# Patient Record
Sex: Female | Born: 1975
Health system: Southern US, Community
[De-identification: ages and names within clinical notes are randomized; demographics above are authoritative.]

## PROBLEM LIST (undated history)

## (undated) DIAGNOSIS — E559 Vitamin D deficiency, unspecified: Secondary | ICD-10-CM

## (undated) DIAGNOSIS — R002 Palpitations: Secondary | ICD-10-CM

## (undated) DIAGNOSIS — N83209 Unspecified ovarian cyst, unspecified side: Secondary | ICD-10-CM

## (undated) DIAGNOSIS — K589 Irritable bowel syndrome without diarrhea: Secondary | ICD-10-CM

## (undated) DIAGNOSIS — L565 Disseminated superficial actinic porokeratosis (DSAP): Secondary | ICD-10-CM

## (undated) DIAGNOSIS — R011 Cardiac murmur, unspecified: Secondary | ICD-10-CM

## (undated) DIAGNOSIS — E039 Hypothyroidism, unspecified: Secondary | ICD-10-CM

## (undated) DIAGNOSIS — F329 Major depressive disorder, single episode, unspecified: Secondary | ICD-10-CM

## (undated) DIAGNOSIS — G935 Compression of brain: Secondary | ICD-10-CM

## (undated) DIAGNOSIS — F32A Depression, unspecified: Secondary | ICD-10-CM

## (undated) DIAGNOSIS — F411 Generalized anxiety disorder: Secondary | ICD-10-CM

## (undated) HISTORY — PX: WISDOM TOOTH EXTRACTION: SHX21

## (undated) HISTORY — DX: Palpitations: R00.2

## (undated) HISTORY — DX: Disseminated superficial actinic porokeratosis (DSAP): L56.5

## (undated) HISTORY — DX: Irritable bowel syndrome, unspecified: K58.9

## (undated) HISTORY — DX: Unspecified ovarian cyst, unspecified side: N83.209

## (undated) HISTORY — PX: OVARIAN CYST REMOVAL: SHX89

## (undated) HISTORY — DX: Cardiac murmur, unspecified: R01.1

## (undated) HISTORY — DX: Hypothyroidism, unspecified: E03.9

## (undated) HISTORY — DX: Vitamin D deficiency, unspecified: E55.9

## (undated) HISTORY — DX: Generalized anxiety disorder: F41.1

## (undated) HISTORY — DX: Major depressive disorder, single episode, unspecified: F32.9

## (undated) HISTORY — DX: Depression, unspecified: F32.A

## (undated) HISTORY — DX: Compression of brain: G93.5

## (undated) HISTORY — PX: BACK SURGERY: SHX140

---
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
## 1999-10-09 ENCOUNTER — Other Ambulatory Visit: Admission: RE | Admit: 1999-10-09 | Discharge: 1999-10-09 | Payer: Self-pay | Admitting: Obstetrics and Gynecology

## 2000-08-13 HISTORY — PX: OVARIAN CYST REMOVAL: SHX89

## 2000-08-16 ENCOUNTER — Ambulatory Visit (HOSPITAL_COMMUNITY): Admission: RE | Admit: 2000-08-16 | Discharge: 2000-08-16 | Payer: Self-pay | Admitting: Obstetrics and Gynecology

## 2000-08-16 ENCOUNTER — Encounter: Payer: Self-pay | Admitting: Obstetrics and Gynecology

## 2000-10-02 ENCOUNTER — Ambulatory Visit (HOSPITAL_COMMUNITY): Admission: RE | Admit: 2000-10-02 | Discharge: 2000-10-02 | Payer: Self-pay | Admitting: Obstetrics and Gynecology

## 2000-10-02 ENCOUNTER — Encounter (INDEPENDENT_AMBULATORY_CARE_PROVIDER_SITE_OTHER): Payer: Self-pay

## 2000-10-16 ENCOUNTER — Other Ambulatory Visit: Admission: RE | Admit: 2000-10-16 | Discharge: 2000-10-16 | Payer: Self-pay | Admitting: Obstetrics and Gynecology

## 2001-10-14 ENCOUNTER — Other Ambulatory Visit: Admission: RE | Admit: 2001-10-14 | Discharge: 2001-10-14 | Payer: Self-pay | Admitting: Gynecology

## 2002-05-14 ENCOUNTER — Inpatient Hospital Stay (HOSPITAL_COMMUNITY): Admission: AD | Admit: 2002-05-14 | Discharge: 2002-05-14 | Payer: Self-pay | Admitting: Obstetrics and Gynecology

## 2002-05-14 ENCOUNTER — Encounter: Payer: Self-pay | Admitting: Obstetrics and Gynecology

## 2002-08-18 ENCOUNTER — Ambulatory Visit (HOSPITAL_COMMUNITY): Admission: RE | Admit: 2002-08-18 | Discharge: 2002-08-18 | Payer: Self-pay | Admitting: Obstetrics and Gynecology

## 2002-08-18 ENCOUNTER — Encounter: Payer: Self-pay | Admitting: Obstetrics and Gynecology

## 2002-09-14 ENCOUNTER — Encounter: Payer: Self-pay | Admitting: Obstetrics and Gynecology

## 2002-09-14 ENCOUNTER — Ambulatory Visit (HOSPITAL_COMMUNITY): Admission: RE | Admit: 2002-09-14 | Discharge: 2002-09-14 | Payer: Self-pay | Admitting: Obstetrics and Gynecology

## 2002-09-28 ENCOUNTER — Encounter: Payer: Self-pay | Admitting: Obstetrics and Gynecology

## 2002-09-28 ENCOUNTER — Ambulatory Visit (HOSPITAL_COMMUNITY): Admission: RE | Admit: 2002-09-28 | Discharge: 2002-09-28 | Payer: Self-pay | Admitting: Obstetrics and Gynecology

## 2002-12-07 ENCOUNTER — Ambulatory Visit (HOSPITAL_COMMUNITY): Admission: RE | Admit: 2002-12-07 | Discharge: 2002-12-07 | Payer: Self-pay | Admitting: Obstetrics and Gynecology

## 2002-12-07 ENCOUNTER — Encounter: Payer: Self-pay | Admitting: Obstetrics and Gynecology

## 2003-01-06 ENCOUNTER — Ambulatory Visit (HOSPITAL_COMMUNITY): Admission: RE | Admit: 2003-01-06 | Discharge: 2003-01-06 | Payer: Self-pay | Admitting: Obstetrics and Gynecology

## 2003-01-06 ENCOUNTER — Encounter: Payer: Self-pay | Admitting: Obstetrics and Gynecology

## 2003-04-09 ENCOUNTER — Inpatient Hospital Stay (HOSPITAL_COMMUNITY): Admission: AD | Admit: 2003-04-09 | Discharge: 2003-04-09 | Payer: Self-pay | Admitting: Obstetrics and Gynecology

## 2003-05-08 ENCOUNTER — Inpatient Hospital Stay (HOSPITAL_COMMUNITY): Admission: AD | Admit: 2003-05-08 | Discharge: 2003-05-10 | Payer: Self-pay | Admitting: Obstetrics and Gynecology

## 2003-06-23 ENCOUNTER — Other Ambulatory Visit: Admission: RE | Admit: 2003-06-23 | Discharge: 2003-06-23 | Payer: Self-pay | Admitting: Obstetrics and Gynecology

## 2003-08-31 ENCOUNTER — Ambulatory Visit (HOSPITAL_COMMUNITY): Admission: RE | Admit: 2003-08-31 | Discharge: 2003-08-31 | Payer: Self-pay | Admitting: Obstetrics and Gynecology

## 2004-06-21 ENCOUNTER — Other Ambulatory Visit: Admission: RE | Admit: 2004-06-21 | Discharge: 2004-06-21 | Payer: Self-pay | Admitting: Obstetrics and Gynecology

## 2004-07-13 ENCOUNTER — Ambulatory Visit (HOSPITAL_COMMUNITY): Admission: RE | Admit: 2004-07-13 | Discharge: 2004-07-13 | Payer: Self-pay | Admitting: Obstetrics and Gynecology

## 2004-07-17 ENCOUNTER — Ambulatory Visit (HOSPITAL_COMMUNITY): Admission: RE | Admit: 2004-07-17 | Discharge: 2004-07-17 | Payer: Self-pay | Admitting: Obstetrics and Gynecology

## 2004-07-24 ENCOUNTER — Ambulatory Visit (HOSPITAL_COMMUNITY): Admission: RE | Admit: 2004-07-24 | Discharge: 2004-07-24 | Payer: Self-pay | Admitting: Obstetrics and Gynecology

## 2004-08-08 ENCOUNTER — Ambulatory Visit (HOSPITAL_COMMUNITY): Admission: RE | Admit: 2004-08-08 | Discharge: 2004-08-08 | Payer: Self-pay | Admitting: Obstetrics and Gynecology

## 2004-08-18 ENCOUNTER — Ambulatory Visit (HOSPITAL_COMMUNITY): Admission: RE | Admit: 2004-08-18 | Discharge: 2004-08-18 | Payer: Self-pay | Admitting: Obstetrics and Gynecology

## 2004-10-12 ENCOUNTER — Ambulatory Visit (HOSPITAL_COMMUNITY): Admission: RE | Admit: 2004-10-12 | Discharge: 2004-10-12 | Payer: Self-pay | Admitting: Obstetrics and Gynecology

## 2004-10-31 ENCOUNTER — Ambulatory Visit (HOSPITAL_COMMUNITY): Admission: RE | Admit: 2004-10-31 | Discharge: 2004-10-31 | Payer: Self-pay | Admitting: Obstetrics and Gynecology

## 2005-03-15 ENCOUNTER — Inpatient Hospital Stay (HOSPITAL_COMMUNITY): Admission: AD | Admit: 2005-03-15 | Discharge: 2005-03-15 | Payer: Self-pay | Admitting: Obstetrics and Gynecology

## 2005-03-17 ENCOUNTER — Inpatient Hospital Stay (HOSPITAL_COMMUNITY): Admission: AD | Admit: 2005-03-17 | Discharge: 2005-03-18 | Payer: Self-pay | Admitting: Obstetrics and Gynecology

## 2005-06-28 ENCOUNTER — Other Ambulatory Visit: Admission: RE | Admit: 2005-06-28 | Discharge: 2005-06-28 | Payer: Self-pay | Admitting: Obstetrics and Gynecology

## 2005-09-10 ENCOUNTER — Ambulatory Visit (HOSPITAL_COMMUNITY): Admission: RE | Admit: 2005-09-10 | Discharge: 2005-09-10 | Payer: Self-pay | Admitting: Obstetrics and Gynecology

## 2006-07-30 ENCOUNTER — Ambulatory Visit (HOSPITAL_COMMUNITY): Admission: RE | Admit: 2006-07-30 | Discharge: 2006-07-30 | Payer: Self-pay | Admitting: Obstetrics and Gynecology

## 2007-01-05 ENCOUNTER — Inpatient Hospital Stay (HOSPITAL_COMMUNITY): Admission: AD | Admit: 2007-01-05 | Discharge: 2007-01-06 | Payer: Self-pay | Admitting: Obstetrics and Gynecology

## 2007-02-25 ENCOUNTER — Ambulatory Visit (HOSPITAL_COMMUNITY): Admission: RE | Admit: 2007-02-25 | Discharge: 2007-02-25 | Payer: Self-pay | Admitting: Obstetrics and Gynecology

## 2007-04-01 ENCOUNTER — Inpatient Hospital Stay (HOSPITAL_COMMUNITY): Admission: AD | Admit: 2007-04-01 | Discharge: 2007-04-01 | Payer: Self-pay | Admitting: Obstetrics and Gynecology

## 2007-04-17 ENCOUNTER — Inpatient Hospital Stay (HOSPITAL_COMMUNITY): Admission: AD | Admit: 2007-04-17 | Discharge: 2007-04-18 | Payer: Self-pay | Admitting: Obstetrics and Gynecology

## 2010-02-08 ENCOUNTER — Inpatient Hospital Stay (HOSPITAL_COMMUNITY): Admission: AD | Admit: 2010-02-08 | Discharge: 2010-02-08 | Payer: Self-pay | Admitting: Obstetrics and Gynecology

## 2010-03-12 ENCOUNTER — Inpatient Hospital Stay (HOSPITAL_COMMUNITY): Admission: AD | Admit: 2010-03-12 | Discharge: 2010-03-14 | Payer: Self-pay | Admitting: Obstetrics and Gynecology

## 2010-10-27 LAB — CBC
HCT: 35.3 % — ABNORMAL LOW (ref 36.0–46.0)
Hemoglobin: 11.9 g/dL — ABNORMAL LOW (ref 12.0–15.0)
MCH: 31.9 pg (ref 26.0–34.0)
MCHC: 33.7 g/dL (ref 30.0–36.0)
MCV: 94.6 fL (ref 78.0–100.0)
Platelets: 136 10*3/uL — ABNORMAL LOW (ref 150–400)
RBC: 3.73 MIL/uL — ABNORMAL LOW (ref 3.87–5.11)
RDW: 12.9 % (ref 11.5–15.5)
WBC: 12.5 10*3/uL — ABNORMAL HIGH (ref 4.0–10.5)

## 2010-10-28 LAB — CBC
HCT: 37.8 % (ref 36.0–46.0)
Hemoglobin: 13 g/dL (ref 12.0–15.0)
MCH: 32 pg (ref 26.0–34.0)
MCHC: 34.4 g/dL (ref 30.0–36.0)
MCV: 93 fL (ref 78.0–100.0)
Platelets: 146 10*3/uL — ABNORMAL LOW (ref 150–400)
RBC: 4.07 MIL/uL (ref 3.87–5.11)
RDW: 13.1 % (ref 11.5–15.5)
WBC: 9.3 10*3/uL (ref 4.0–10.5)

## 2010-10-28 LAB — RPR: RPR Ser Ql: NONREACTIVE

## 2010-10-29 LAB — URINALYSIS, ROUTINE W REFLEX MICROSCOPIC
Bilirubin Urine: NEGATIVE
Glucose, UA: NEGATIVE mg/dL
Hgb urine dipstick: NEGATIVE
Ketones, ur: 15 mg/dL — AB
Nitrite: NEGATIVE
Protein, ur: NEGATIVE mg/dL
Specific Gravity, Urine: 1.01 (ref 1.005–1.030)
Urobilinogen, UA: 0.2 mg/dL (ref 0.0–1.0)
pH: 6.5 (ref 5.0–8.0)

## 2010-12-26 NOTE — H&P (Signed)
Brandy Ortega, Brandy Ortega         ACCOUNT NO.:  [id number]   MEDICAL RECORD NO.:  [id number]          PATIENT TYPE:  INP   LOCATION:  9164                          FACILITY:  [facility]   PHYSICIAN:  Crist Fat. Rivard, M.D. DATE OF BIRTH:  December 06, 1975   DATE OF ADMISSION:  04/17/2007  DATE OF DISCHARGE:                              HISTORY & PHYSICAL   Brandy Ortega is a 35 year old gravida 5, para 2-0-2-2 at 40-3/7 weeks,  who presented in active labor with cervix __________ on admission.  She  had onset of labor approximately 4-5 a.m. this morning with rapid  progression.  She denied any leaking or bleeding and reported positive  fetal movement.  She denied any HSV lesions or prodrome and no PIH  symptoms.  Her pregnancy has been remarkable for (1) Group B strep  negative.  (2)  History of dermoid cyst with previous removal.  (3)  History of HSV II, no recent recurrent lesions, no prodrome, and the  patient has been on Valtrex.  (4)  History of infertility with the  patient on progesterone suppositories in early pregnancy.  (5) History  of pyelonephritis.  (6) History of hypothyroidism with the patient on  Synthroid 0.625.   PRENATAL LABS:  Blood type is B positive, Rh antibody negative, VDRL  nonreactive, rubella titer positive, hepatitis B surface antigen  negative, HIV nonreactive, GC and Chlamydia cultures were declined in  the first trimester.  They were also declined at 36 weeks.  Pap smear  was normal in December 2007.  Cystic fibrosis testing was negative.  Group B strep culture at 36 weeks that were negative.  Hemoglobin upon  entry into practice was 12.8.  It was within normal limits at 28 weeks.  The patient had first trimester screening that was normal.  Nuchal  translucency in first trimester screening were normal.  AFP was normal.  Glucola was normal.  Dr. Lucianne Muss followed her TSHs.  The rest of her  pregnancy was essentially uncomplicated.  Group B strep culture was  negative at 36 weeks.  Cultures were also negative.   HISTORY OF PRESENT PREGNANCY:  The patient entered care at approximately  7 weeks.  She had an ultrasound the day prior to that visit with normal  fetal heart tones, and there was a previa noted.  Dr. Lucianne Muss was  following her TSHs.  Fetal heart tones were auscultated on exam  approximately 2 weeks from that.  She also had nuchal translucency at 11-  6/7 weeks which was normal.  Placenta had reverted to low-lying.  AFP  was normal.  She had some nausea and diarrhea at about 18 weeks.  This  was evaluated and was found to be no abnormal findings.  By 19 weeks,  she had another ultrasound showing an anterior placenta without a previa  and normal findings.  She did have a stool culture for C. difficile due  to recurrent diarrhea.  She also had a gallbladder ultrasound during her  pregnancy secondary to persistent nausea and vomiting.  This was  negative.  She had some scattered uterine contractions at 28 weeks.  TSH  by Dr. Lucianne Muss at approximately 26 weeks was 2.5.  The rest of her  pregnancy was essentially uncomplicated.  She did have some fungal  itching in her rectal area at 37 weeks.  She was prescribed Mycolog.  Group B strep cultures and other cultures were negative.   OBSTETRICAL HISTORY:  1. In 2003, August and October, she had first trimester miscarriages.  2. In 2004, she had a vaginal birth of a female infant, weight 7 pounds      2 ounces at 40-5/7 weeks.  She was in labor 8 hours.  She had      epidural anesthesia.  She did have manual removal of placenta.  3. In 2006, she had a vaginal birth of a female infant, weighed 8      pounds 1 ounce at 39 weeks.  She was in labor 9 hours.  She had      epidural anesthesia without complications.   MEDICAL HISTORY:  1. She is a previous condom user.  2. She had a right ovarian cystectomy and a history of a left ovarian      cyst in the past.  3. She did have a right ovarian dermoid  cyst.  4. She did have history of infertility which required Clomid in the      past.  5. She had a spontaneous conception in 2004, 2006, and the current      pregnancy, but she was on progesterone suppositories until about 14      weeks.  6. She did have a history of HSV II but has had rare outbreaks.  7. She reports the usual childhood illnesses.  8. She was diagnosed with hypothyroidism in November 2005.  She is      followed by Dr. Lucianne Muss and has been on Synthroid.  9. She has occasional UTIs.  10.She did have some postpartum depression in 2004, was treated with      Zoloft but has had no issues since.  11.She fell on ice as a young person and had a concussion.   SURGICAL HISTORY:  1. Wisdom teeth removed.  2. Diagnostic laparoscopy.  3. Ovarian cystectomy in the past.  4. She had anesthetic complications of nausea and vomiting in the      past.  5. Only other hospitalizations were for childbirth.   FAMILY HISTORY:  Paternal grandfather had a valve replacement, and a  maternal uncle had an MI.  Paternal grandfather had hypertension.  Paternal uncle had emphysema.  Paternal grandfather and paternal  grandmother had type 2 diabetes.  Paternal grandmother had a CVA event.   GENETIC HISTORY:  Unremarkable.   SOCIAL HISTORY:  The patient is married to the father of the baby.  He  Is involved and supportive.  His name is [employer].  The patient  is Caucasian of the Catholic faith.  She has a Event organiser.  She is a  Publishing rights manager.  Her husband is a Nurse, adult.  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  She denies any alcohol, drug,  or tobacco use during this pregnancy.   PHYSICAL EXAMINATION:  VITAL SIGNS:  Stable.  The patient is afebrile.  HEENT:  Within normal limits.  LUNGS:  Bilateral breath sounds are clear.  HEART:  Regular rate and rhythm without murmur.  BREASTS:  Soft and nontender.  ABDOMEN:  Fundal height is approximately 39.  Soft and  nontender between  contractions, but uterine contractions are every 2-3  minutes.  Fetal  heart rate is reassuring with a negative spontaneous CST.  CERVIX:  __________  100% vertex at a 0 station with an intact bag of  water.  There are no HSV lesions noted.  The patient also denies any  prodrome.  The patient does have scattered keratotic lesions over her  whole body.  This has been a genetic issue.  EXTREMITIES:  Deep tendon reflexes are 2+ without clonus.  There is a  trace edema noted.   IMPRESSION:  1. Intrauterine pregnancy at 40-3/7 weeks.  2. Active labor and now in transition.  3. Group B strep negative.   PLAN:  1. Admit to birthing suite for consult with Dr. Estanislado Pandy as attending      physician.  2. The patient strongly requesting epidural.  She feels more pain than      pressure with her current cervical exam.  We will attempt to place      the epidural prior to delivery, but the patient understands that      labor may progress and preclude the opportunity for safe epidural.      Chip Boer L. Emilee Hero, C.N.M.      Crist Fat Rivard, M.D.  Electronically Signed    VLL/MEDQ  D:  04/17/2007  T:  04/17/2007  Job:  [id number]

## 2010-12-29 NOTE — H&P (Signed)
[facility]  Patient:    Brandy Ortega, Brandy Ortega                  MRN: [id number] Adm. Date:  10/02/00 Attending:  Erie Noe P. Pennie Rushing, M.D.                         History and Physical  HISTORY OF PRESENT ILLNESS:   The patient is a 35 year old white married female para 0 who presents for ovarian cystectomy for a presumed right ovarian dermoid cyst. The patient had this first noted at the time of an examination in December of 2001 during the process of ovulation induction for infertility. The patient had undergone three cycles of Clomid with one of those being ovulatory at 150 mg. At the time of her ovary check in December, the patient was noted to have right ovarian enlargement which was documented on ultrasound and persisted over the next cycles with characteristics of a dermoid. The patient was given the option of ovarian cystectomy for this presumed dermoid versus further ovulation induction and has opted for ovarian cystectomy. The ultrasound shows the dermoid to be approximately 2 cm in size.  PAST MEDICAL HISTORY:         The patient has a history of pyelonephritis. She has occasional migraines managed with rest. She has had the usual childhood illnesses. She did have a concussion after a fall in 1997 with an approximately 5-hour period of amnesia without any residual. She has some seasonal allergies for which she uses Rhinocort.  GYNECOLOGIC HISTORY:          The patient had menarche at age 79 with menses every 24 to 32 days, then regularized on oral contraceptive pills. She has returned to that pattern after discontinuing oral contraceptive pills in September of 2000. Her last menstrual period began on July 21, 2000. She had her last Pap smear in February of 2001 and was normal. She has no history of abnormal Pap smears. She does have a history of Bartholin gland cyst that is asymptomatic never enlarges more than a pea size.  CURRENT  MEDICATIONS:          1. Rhinocort.                               2. Multivitamin with 400 mcg of folic acid.  SOCIAL HISTORY/HABITS:        The patient is employed and lives with her husband who is employed by the [employer].  ALLERGIES:                    None known.  FAMILY HISTORY:               Positive for heart disease, mitral valve prolapse, hypertension, diabetes, asthma, and hypothyroidism.  REVIEW OF SYSTEMS:            Specifically negative for any abdominal pain or dyspareunia.  PHYSICAL EXAMINATION:  VITAL SIGNS:                  Blood pressure is 110/70.  LUNGS:                        Clear.  HEART:                        Regular rate  and rhythm.  ABDOMEN:                      Soft, without masses, organomegaly, or tenderness.  PELVIC:                       EG/BUS within normal limits. The vagina is rugous. The cervix is nontender. The uterus is normal size, shape, consistency, anterior, mobile, and nontender. Adnexa:  There is minimal enlargement of the right ovary which is nontender. Left adnexa without mass. Rectovaginal confirms.  IMPRESSION:                   Probable right ovarian dermoid cyst, persistent.  DISPOSITION:                  A laparoscopic removal of the right ovary cyst will be undertaken. The patient seems to understand the risks of anesthesia, bleeding, infection, and damage to adjacent organs. She wishes to proceed. This will be done at [facility] on October 02, 2000. DD:  10/01/00 TD:  10/01/00 Job: [id number]

## 2010-12-29 NOTE — H&P (Signed)
Brandy Ortega, Brandy Ortega         ACCOUNT NO.:  [id number]   MEDICAL RECORD NO.:  [id number]          PATIENT TYPE:  AMB   LOCATION:  SDC                           FACILITY:  [facility]   PHYSICIAN:  Hal Morales, M.D.DATE OF BIRTH:  Jun 01, 1976   DATE OF ADMISSION:  DATE OF DISCHARGE:                                HISTORY & PHYSICAL   HISTORY OF PRESENT ILLNESS:  Brandy Ortega is 35 year old married white  female, para 1-0-2-1 who presents for a laparoscopic left ovarian cystectomy  because of a persistent left ovarian cyst which is presumed to be a dermoid.  In 2002, the patient underwent a laparoscopic right ovarian cystectomy for a  dermoid cyst along with a conservative resection of endometriosis.  In  October of 2003, while being evaluated for pelvic pain in early pregnancy,  she was noted to have a 1.8 cm x 1.5 cm x 1.1 cm complex left ovarian cyst  which was suspicious for a dermoid.  A followup ultrasound in January of  2004, following the pregnancy which ended in spontaneous abortion, it was  shown that there had been no significant change in her left ovarian cyst as  it measured 1.7 cm x 1.2 cm x 1.5 cm.  The remainder of that study was  within normal limits. Essentially the same status of this patient's left  ovarian cyst was confirmed in February 2004 (measurements being 1.5 x 1.2 x  1.5 cm) during an ultrasound for first trimester bleeding.  In January of  2005, the patient began to complain of an intermittent burning sensation in  her left lower quadrant which had been occuring weekly for several months.  An ultrasound at that time was within normal limits except there was a  stable left ovarian cyst measuring 1.5 x 1.5 x 1.4 cm which was probably a  dermoid.  The patient's symptoms of burning sensation in her left lower  quadrant abated over time and she denies any symptoms since that time.  Additionally she has not had any dyspareunia, urinary tract symptoms or  change in her bowel movements.  In September of 2005, the patient had a  followup ultrasound to evaluate her left ovarian cyst status which revealed  an increase in size to 2.8 cm x 1.3 cm.  Given the patient's history of a  right dermoid and her desire to try to conceive in the spring of 2006, she  wishes to proceed with a left ovarian cystectomy by laparoscopy.   OB HISTORY:  Gravida 3, para 1-0-2-1, patient has a history of infertility,  postpartum depression and obsessive compulsive disorder during the  postpartum period.   GYN HISTORY:  Menarche 35 years old, last menstrual period was June 04, 2004. The patient is currently not using any contraction, has a history of  herpes simplex virus #2, denies any history of abnormal Pap smears.  Last  normal Pap smear was November 2005.   PAST MEDICAL HISTORY:  Endometriosis, migraines, Bartholin cyst, thyroid  goiter, a concussion secondary to a fall with five hours of amnesia;  however, no residual (1997), hypothyroidism, oligomenorrhea, and  pyelonephritis.  PAST SURGICAL HISTORY:  In 1995 dental surgery, 2002 right ovarian  cystectomy for a dermoid cyst along with conservative resection or  endometriosis and chromopertubation. The patient denies any history of blood  transfusions or problems with anesthesia.   FAMILY HISTORY:  Cardiovascular disease, asthma, hypothyroidism,  hypertension, diabetes and mitral valve prolapse.   SOCIAL HISTORY:  The patient is married and she works as a Engineer, civil (consulting).   HABITS:  She does not use tobacco and she drinks alcohol rarely.   CURRENT MEDICATIONS:  1.  Synthroid 50 mcg, 1 tablet daily.  2.  Multivitamins 1 tablet daily.  3.  Folate 400 mcg, 1 tablet daily.  4.  Valtrex 500 mg, 1 tablet twice daily for 3 days as needed.   ALLERGIES:  The patient has no known drug allergies.   REVIEW OF SYMPTOMS:  Positive for glasses/contact lenses, occasional muscle  related neck pain, palpitations  (negative cardiac workup with Dr. Fraser Din in  2004), seasonal allergies.   PHYSICAL EXAMINATION:  VITAL SIGNS:  Blood pressure 100/60, weight 178,  height 5 feet 7 inches tall.  NECK:  Supple. There is no adenopathy; however, the patient does have  diffuse enlargement of her right thyroid lobe.  HEART:  Regular rate and rhythm.  There is a 1/6 systolic ejection murmur at  the left sternal border.  There is no rub or gallop.  CHEST:  Lungs are clear to auscultation.  There are no wheezes, rales or  rhonchi.  BACK:  No CVA tenderness.  ABDOMEN:  Bowel sounds are present, soft without tenderness, guarding,  rebound or organomegaly.  EXTREMITIES:  Without cyanosis, clubbing or edema.  PELVIC:  EGBUS is within normal limits. Vagina is rugose, cervix is  nontender without lesions. The uterus is normal size, shape and consistency  without tenderness. Adnexa without tenderness or palpable masses.  Rectovaginal exam without tenderness or masses.  Urine pregnancy test is  negative.   IMPRESSION:  Persistent left ovarian cyst--probable dermoid.   DISPOSITION:  A discussion was held with the patient regarding the  indication for her procedure along with its risk which includes but are not  limited to reaction to anesthesia, damage to adjacent organs, infection and  excessive bleeding.  The patient has accepted these risks and has consented  to undergo a laparoscopic left ovarian cystectomy at [facility] on July 19, 2004 at 7:30 a.m.     Elmi   EJP/MEDQ  D:  07/12/2004  T:  07/12/2004  Job:  [id number]

## 2010-12-29 NOTE — H&P (Signed)
NAMEALEXZIA, KASLER         ACCOUNT NO.:  [id number]   MEDICAL RECORD NO.:  [id number]          PATIENT TYPE:  INP   LOCATION:  9174                          FACILITY:  [facility]   PHYSICIAN:  Hal Morales, M.D.DATE OF BIRTH:  05/21/1976   DATE OF ADMISSION:  03/17/2005  DATE OF DISCHARGE:                                HISTORY & PHYSICAL   Ms. Vanallen is a 35 year old married, white female, gravida 4, para 1, 0,  2, 1, at 39-1/7 weeks who presents with contractions tonight. She denies  leaking, bleeding, or signs and symptoms of PIH. She denies HSV prodrome.  Her pregnancy has been followed by the [facility] certified  nurse midwife service and has been remarkable for:   1.  Hypothyroid.  2.  History of ovarian cystectomy on the left.  3.  History of HSV-2.  4.  History of two spontaneous abortions.  5.  First trimester bleeding.  6.  Group B strep negative.  7.  Right ovarian dermoid cyst   PRENATAL LABS:  Collected on August 30, 2004: Hemoglobin 12.0, hematocrit  35.4, platelets 212,000.  Hepatitis B surface antigen negative. Rubella immune. Blood type B positive.  Antibody negative. HIV nonreactive. One hour Glucola from Dec 29, 2004 was  within normal limits. Coxsackie titers on Dec 21, 2004:  A was negative and  B was 3:5+. Her convalescent titers on Dec 29, 2004 within normal limits.  Culture of the vaginal tract for group B strep on February 20, 2005 was  negative.   HISTORY OF PRESENT PREGNANCY:  She presented for care at [facility] on  August 30, 2004 at 10-4/[redacted] weeks gestation.  She was being followed by Dr.  Lucianne Muss for her hypothyroidism and he planned TSH draws every 8 weeks  throughout the pregnancy. Pregnancy ultrasonography at [redacted] weeks gestation  showed growth consistent with previous dating, confirming [facility] of March 23, 2005. Anatomy scan was complete at a 20-weeks scan. The patient was exposed  to Coxsackie through her son at [redacted] weeks  gestation. Her titer showed  immunity. The rest of her prenatal care was unremarkable.   OBSTETRIC HISTORY:  She is a gravida 4, para 1, 0, 2, 1. In August of 2003  she had a first trimester SAB. In October, 2003 she had a first trimester  SAB. September, 2004 she had a vaginal delivery of a female infant weighing 7  pounds and 2 ounces at 40-5/[redacted] weeks gestation after 8 hours in labor. She  had an epidural for anesthesia. His name is Ethelene Browns. She had manual removal  of placenta.   ALLERGIES:  No known drug allergies.   PAST MEDICAL HISTORY:  She experienced menarche at the age of 41 with 35+  day cycles. She had a history of infertility requiring Clomid in the past  but with two spontaneous conceptions in 2004 and the current pregnancy. She  had a left ovarian cyst that was stable x2 years. She had a right ovarian  cystectomy. She has rare outbreaks of HSV. She has occasional yeast  infections. She was diagnosed with hypothyroidism in November, 2005. She  sees Dr. Lucianne Muss for that. She had postpartum depression requiring Zoloft. As  a child she fell on the ice and had a concussion. Headache history is  negative.   PAST SURGICAL HISTORY:  Remarkable for extraction of wisdom teeth. She had a  right ovarian cystectomy in the past.   FAMILY HISTORY:  Remarkable for paternal grandfather with valve replacement.  Maternal uncle with myocardial infarction. Maternal grandfather with chronic  hypertension. Paternal uncle with emphysema. Family members with type 2  diabetes. Maternal grandmother with CVA.   SOCIAL HISTORY:  The patient is married to the father of the baby, his name  is Thayer Ohm. He is involved and supportive. They are of the Catholic faith.  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  Father of the baby has 16 years of education and is employed full time as a  Emergency planning/management officer. They deny any alcohol, tobacco or illicit drug use with the  pregnancy.    PHYSICAL EXAMINATION:  VITAL SIGNS:  Stable, she is afebrile.  HEENT:  Grossly within normal limits.  CHEST:  Clear to auscultation.  HEART:  Regular rate and rhythm.  ABDOMEN:  Is gravid in contour, fundal height extends approximately 39 cm  above the pubic symphysis. Fetal heart rate is reassuring with intermittent  monitoring. Contractions every 3 minutes. Cervix is 5 to 6 cm, 80%, vertex  at the 0 station with intact bag of waters. No signs of HSV lesions present.  EXTREMITIES:  Normal.   ASSESSMENT:  1.  Intrauterine pregnancy at term.  2.  Active labor.  3.  Group B strep negative.   PLAN:  1.  Admit to birthing suite. Dr. Pennie Rushing has been notified.  2.  Routine C.N.M. orders.  3.  Patient plans epidural.  4.  Anticipate normal spontaneous vaginal birth.       KS/MEDQ  D:  03/17/2005  T:  03/17/2005  Job:  [id number]

## 2010-12-29 NOTE — H&P (Signed)
NAME:  Brandy Ortega, Brandy Ortega                   ACCOUNT NO.:  [id number]   MEDICAL RECORD NO.:  [id number]                   PATIENT TYPE:  INP   LOCATION:  9170                                 FACILITY:  [facility]   PHYSICIAN:  Janine Limbo, M.D.            DATE OF BIRTH:  08/07/1976   DATE OF ADMISSION:  05/08/2003  DATE OF DISCHARGE:                                HISTORY & PHYSICAL   HISTORY OF PRESENT ILLNESS:  Ms. Brandy Ortega is a 35 year old, gravida 3,  para 2-0-0-2, at 39-2/7 weeks who presented with onset of labor at  approximately 2:30 a.m., now with contractions every two minutes.  She  denies any spontaneous rupture of membranes.  She does have positive bloody  show.  She reports positive fetal movement.  The pregnancy has been  remarkable for:  1.  Two SABs.  2.  History of infertility.  3.  History of  dermoid cyst.  5.  History of HSV, but no recent or current lesions.  6.  History of pyelonephritis.  7.  First trimester bleeding.   PRENATAL LABORATORY DATA:  Blood type is B positive.  Rh antibody negative.  VDRL nonreactive.  Rubella titer positive.  Hepatitis B surface antigen  negative.  HIV nonreactive.  Group B Streptococcus culture was negative at  36 weeks.  Pap was normal.  Glucose challenge was normal.  AFP was normal.  The hemoglobin upon entry into practice was 11.6 at 28 weeks.  An EDC of  May 13, 2003, was established by ultrasound at approximately 8 weeks  and confirmed with ultrasound at approximately 18 weeks.   HISTORY OF PRESENT PREGNANCY:  The patient entered care at approximately 9  weeks.  She was on progesterone suppositories secondary to a history of  first trimester losses and first trimester spotting.  The decision was made  to follow up on Pap with her postpartum exam.  She also was on baby aspirin  until after her 18-week ultrasound.  She had occasional PVCs.  She had a  cardiology consult.  She had some reflux during her pregnancy  that was  treated with Prevacid or Prilosec.  She had a bright red speck of bleeding  at 33 weeks with no abnormal findings.  She began Valtrex suppression at 34  weeks.  She had a small hemorrhoid noted at 36 weeks.  The rest of her  pregnancy was unremarkable.   OBSTETRICAL HISTORY:  In August of 2003, she had a first trimester  miscarriage.  In October of 2003, she had another first trimester  miscarriage.   PAST MEDICAL HISTORY:  1. She was on oral contraceptives until 2000 and condom use after that, but     no pregnancy occurred.  2. She had a laparoscopy in the past for removal of a dermoid cyst on the     right.  3. She has a history of a hysterosalpingogram and was on Clomid for six  cycles in 2001, however, this was a spontaneous pregnancy.  4. She has occasional yeast infection.  5. She does have a history of HSV, but has had rare outbreaks.  6. She had cystitis and pyelonephritis one time when she was younger.  7. She had migraine headaches while in college.  8. She did have a concussion after a fall on the ice in the past.   PAST SURGICAL HISTORY:  1. Wisdom teeth removed.  2. Diagnostic laparoscopy for removal of the dermoid cyst.  She did have     severe nausea coming out of anesthesia.   ALLERGIES:  She has no known medication allergies.   FAMILY HISTORY:  Her paternal grandfather had valve replacement.  A maternal  uncle had an MI and is now deceased.  Her paternal grandfather had chronic  hypertension.  A paternal uncle had emphysema.  Her paternal grandfather had  diabetes.  Her paternal grandmother had type 2 diabetes.  Her mother has  hypothyroidism.  Her paternal grandmother had a stroke.  Her father uses  alcohol.  Paternal uncles used drugs and alcohol.   GENETIC HISTORY:  Unremarkable.   SOCIAL HISTORY:  The patient is married to the father of the baby.  He is  involved and supportive.  His name is [employer].  The patient is  Caucasian and of  the Catholic faith.  She has been followed by the certified  nurse midwife service at [facility].  She denies any alcohol, drug,  or tobacco use during this pregnancy.  She has a Music therapist and she  is employed as a Engineer, civil (consulting).  Her husband has a Naval architect.  He is  employed as a Emergency planning/management officer.   PHYSICAL EXAMINATION:  VITAL SIGNS:  Stable.  The patient is afebrile.  HEENT:  Within normal limits.  LUNGS:  Bilateral breath sounds are clear.  HEART:  Regular rate and rhythm without murmur.  BREASTS:  Soft and nontender.  ABDOMEN:  The fundal height is approximately 38 cm.  The estimated fetal  weight is 7-8 pounds.  Uterine contractions every two minutes of strong  quality.  PELVIC:  Cervical exam 8 cm, 90%, and vertex at a 0 station with an intact  bag of water.  The fetal heart rate is reactive with no decelerations.  No  HSV lesions noted.  No prodromal symptoms reported.  EXTREMITIES:  The deep tendon reflexes are 2+ without clonus.  There is a  trace edema noted.   IMPRESSION:  1. Intrauterine pregnancy at 39-2/7 weeks.  2. Transitional labor.  3. Negative group B Streptococcus.  4. History of herpes simplex virus, but no recent recurrent outbreaks or     lesions.   PLAN:  1. Admit to birth suite per consult with Janine Limbo, M.D., as     attending physician.  2. The patient desires epidural.  Will try to place prior to delivery.  3. Routine certified nurse midwife orders.     Brandy Ortega, C.N.M.                   Janine Limbo, M.D.    VLL/MEDQ  D:  05/08/2003  T:  05/08/2003  Job:  [id number]

## 2010-12-29 NOTE — Op Note (Signed)
[facility]  Patient:    Brandy Ortega, Brandy Ortega                  MRN: [id number] Proc. Date: 10/02/00 Adm. Date:  [id number] Attending:  Dierdre Forth Pearline                           Operative Report  PREOPERATIVE DIAGNOSIS:       Right ovarian cyst, probable dermoid.  POSTOPERATIVE DIAGNOSES:      1. Right ovarian cyst, probable dermoid.                               2. Endometriosis.  OPERATION:                    1. Operative laparoscopy.                               2. Right ovarian cystectomy.                               3. Conservative resection of endometriosis                                  of posterior cul-de-sac.                               4. Chromopertubation.  SURGEON:                      Vanessa P. Haygood, M.D.  ASSISTANT:                    Henreitta Leber, P.A.-C.  ANESTHESIA:                   General orotracheal.  ESTIMATED BLOOD LOSS:         Less than 100 cc.  COMPLICATIONS:                None.  INTRAOPERATIVE FINDINGS:      The uterus was normal and the tubes were normal bilaterally with bilateral patency documented. The left ovary contained a corpus luteum cyst. The right ovary contained a 2 cm apparent dermoid cyst. There were no adhesions in the ovarian areas. In the posterior cul-de-sac and the midportion just behind the cervix there was a peritoneal window which could be consistent with endometriosis. Beneath the right uterosacral ligament, there was a powder burn lesion with stellate puckering of the peritoneum consistent with endometriosis. No other areas of endometriosis were noted.  DESCRIPTION OF PROCEDURE:     The patient was taken to the operating room after appropriate identification and placed on the operating room table. After the attainment of adequate general anesthesia, she was placed in the modified lithotomy position. The abdomen, perineum and vagina were prepped with multiple layers of  Betadine. A Foley catheter was inserted into the bladder under sterile conditions and connected to straight drainage. A single tooth tenaculum was placed on the anterior cervix and a cannula placed into the cervix for chromopertubation. The abdomen was draped as a sterile field. Marcaine 0.25% was used to infiltrate the subumbilical  and suprapubic regions where incisions were anticipated; a total of 10 cc was used.  A subumbilical incision was made and the Veress cannula placed through that incision into the peritoneal cavity. A pneumoperitoneum was created with 3 L of CO2. The Veress cannula was removed and laparoscopic trocar placed through that incision into the peritoneal cavity. A laparoscope was placed through the trocar sleeve. Suprapubic incisions were made to the right and left of midline and laparoscopic probe and trocars were placed through those incisions into the peritoneal cavity under direct visualization. The above noted findings were made and documented. The chromopertubation was carried out with spill initially from the right tube and then from the left tube and documented. The harmonic scalpel was used to excise the aforementioned areas of endometriosis both the peritoneal window and the powder burn stellate lesion. Hemostasis was noted to be adequate. The right ovary was then elevated and incised with the harmonic scalpel on the antimesenteric side. The cortex was grasped and with a combination of hydrodissection and blunt dissection, the dermoid cyst was removed from the overlying cortex. It was then placed in an Endobag and removed through the umbilical incision. The 10 mm laparoscope trocar was replaced through the umbilical incision and the 5 mm scope that had been used during the retrieval of the dermoid cyst changed over to the 10 mm scope which was used for the remainder of the procedure. The left ovary was evaluated carefully with incision of the cortex revealing  an underlying corpus luteum cyst. Hemostasis was noted to be adequate. Copious irrigation was carried out and approximately 100 cc of warm Ringers lactate left in the pelvis. All instruments were then removed from the peritoneal cavity under direct visualization as the CO2 was allowed to escape. Fascial sutures of #0 Vicryl were placed in the umbilical incision and tied down. The skin incision was reapproximated with subcuticular sutures of 3-0 Vicryl. 3-0 Vicryl was used to close the suprapubic skin incisions. Sterile dressings were applied. The Foley catheter, single tooth tenaculum and cannula were removed. The patient was awakened from general anesthesia and taken to the recovery room in satisfactory condition having tolerated the procedure well, with sponge, needle, lap and instrument counts correct.  SPECIMENS:                    Right ovarian cyst, probable dermoid and                               cul-de-sac excision, probable endometriosis. DD:  10/02/00 TD:  10/03/00 Job: [id number]

## 2011-05-25 LAB — CBC
HCT: 35.7 — ABNORMAL LOW
HCT: 39.3
Hemoglobin: 12.4
Hemoglobin: 13.8
MCHC: 34.9
MCHC: 35.2
MCV: 90.7
MCV: 92
Platelets: 144 — ABNORMAL LOW
Platelets: 181
RBC: 3.88
RBC: 4.33
RDW: 13.2
RDW: 13.2
WBC: 12.1 — ABNORMAL HIGH
WBC: 12.3 — ABNORMAL HIGH

## 2011-05-25 LAB — RPR: RPR Ser Ql: NONREACTIVE

## 2012-05-30 ENCOUNTER — Ambulatory Visit: Payer: Self-pay | Admitting: Obstetrics and Gynecology

## 2012-06-24 ENCOUNTER — Encounter: Payer: Self-pay | Admitting: Obstetrics and Gynecology

## 2012-06-24 ENCOUNTER — Ambulatory Visit (INDEPENDENT_AMBULATORY_CARE_PROVIDER_SITE_OTHER): Payer: 59 | Admitting: Obstetrics and Gynecology

## 2012-06-24 VITALS — BP 98/70 | Ht 66.0 in | Wt 158.0 lb

## 2012-06-24 DIAGNOSIS — G935 Compression of brain: Secondary | ICD-10-CM | POA: Insufficient documentation

## 2012-06-24 DIAGNOSIS — E559 Vitamin D deficiency, unspecified: Secondary | ICD-10-CM | POA: Insufficient documentation

## 2012-06-24 DIAGNOSIS — Z124 Encounter for screening for malignant neoplasm of cervix: Secondary | ICD-10-CM

## 2012-06-24 DIAGNOSIS — N83209 Unspecified ovarian cyst, unspecified side: Secondary | ICD-10-CM | POA: Insufficient documentation

## 2012-06-24 DIAGNOSIS — E039 Hypothyroidism, unspecified: Secondary | ICD-10-CM | POA: Insufficient documentation

## 2012-06-24 DIAGNOSIS — K589 Irritable bowel syndrome without diarrhea: Secondary | ICD-10-CM | POA: Insufficient documentation

## 2012-06-24 DIAGNOSIS — L565 Disseminated superficial actinic porokeratosis (DSAP): Secondary | ICD-10-CM | POA: Insufficient documentation

## 2012-06-24 MED ORDER — VALACYCLOVIR HCL 500 MG PO TABS: 500.0000 mg | ORAL_TABLET | Freq: Every day | ORAL | Status: AC | PRN

## 2012-06-24 NOTE — Progress Notes (Signed)
Subjective:    Brandy Ortega is a 36 y.o. female, No obstetric history on file., who presents for an annual exam.  Pt has 4 living children all SVD, still weaning 2yo.  Pt reports intermittent low L sided pelvic pain, states she's had it for years.      History   Social History  . Marital Status: Married    Spouse Name: N/A    Number of Children: N/A  . Years of Education: N/A   Social History Main Topics  . Smoking status: Never Smoker   . Smokeless tobacco: Never Used  . Alcohol Use: Yes     Comment: occasional wine  . Drug Use: No  . Sexually Active: Yes     Comment: Husband had vasectomy   Other Topics Concern  . None   Social History Narrative  . None    Menstrual cycle:   LMP: Patient's last menstrual period was 06/12/2012.           Cycle: regular  The following portions of the patient's history were reviewed and updated as appropriate: allergies, current medications, past family history, past medical history, past social history, past surgical history and problem list.  Review of Systems Pertinent items are noted in HPI. Breast:Negative for breast lump,nipple discharge or nipple retraction Gastrointestinal: Negative for abdominal pain, change in bowel habits or rectal bleeding Urinary:negative   Objective:    BP 98/70  Ht 5\' 6"  (1.676 m)  Wt 158 lb (71.668 kg)  BMI 25.50 kg/m2  LMP 06/12/2012    Weight:  Wt Readings from Last 1 Encounters:  06/24/12 158 lb (71.668 kg)          BMI: Body mass index is 25.50 kg/(m^2).  General Appearance: Alert, appropriate appearance for age. No acute distress HEENT: Grossly normal Neck / Thyroid: Supple, no masses, nodes or enlargement Lungs: clear to auscultation bilaterally Back: No CVA tenderness Breast Exam: No dimpling, nipple retraction or discharge. No masses or nodes. and No masses or nodes.No dimpling, nipple retraction or discharge. Cardiovascular: Regular rate and rhythm. S1, S2, no  murmur Gastrointestinal: Soft, non-tender, no masses or organomegaly Pelvic Exam: Vulva and vagina appear normal. Bimanual exam reveals normal uterus and adnexa. mild tenderness over R adnexa,otherwise normal     Rectovaginal: not indicated Lymphatic Exam: Non-palpable nodes in neck, clavicular, axillary, or inguinal regions Skin: no rash or abnormalities, pt has dermatological condition  Neurologic: Normal gait and speech, no tremor  Psychiatric: Alert and oriented, appropriate affect.   Wet Prep:not applicable Urinalysis:not applicable UPT: Not done   Assessment:    Normal gyn exam    Plan:    pap smear return annually or prn STD screening: declined Contraception:vasectomy rv'd healthy eating, exercise Pt has PCP for thyroid mgmnt rv'd to have mammogram at age 84  S.Leeann Must, CNM

## 2012-06-24 NOTE — Progress Notes (Signed)
Last Pap: 12/2010 Normal WNL: Yes Regular Periods:yes Contraception: Husband had vasectomy  Monthly Breast exam:yes Tetanus<14yrs:yes Nl.Bladder Function:yes Daily BMs:yes Healthy Diet:yes Calcium:yes Mammogram:no Date of Mammogram: none Exercise:yes Have often Exercise: two days a week Crossfit Seatbelt: yes Abuse at home: no Stressful work:no Sigmoid-colonoscopy: None Bone Density: No PCP:no Change in PMH: none Change in [facility]: none

## 2012-06-25 LAB — PAP IG W/ RFLX HPV ASCU

## 2012-10-27 ENCOUNTER — Encounter: Payer: Self-pay | Admitting: *Deleted

## 2012-10-28 ENCOUNTER — Ambulatory Visit (INDEPENDENT_AMBULATORY_CARE_PROVIDER_SITE_OTHER): Payer: 59 | Admitting: Physician Assistant

## 2012-10-28 ENCOUNTER — Encounter: Payer: Self-pay | Admitting: Physician Assistant

## 2012-10-28 VITALS — BP 104/70 | HR 68 | Temp 98.8°F | Resp 18 | Ht 65.5 in | Wt 159.0 lb

## 2012-10-28 DIAGNOSIS — M25552 Pain in left hip: Secondary | ICD-10-CM

## 2012-10-28 DIAGNOSIS — M25559 Pain in unspecified hip: Secondary | ICD-10-CM

## 2012-10-29 NOTE — Progress Notes (Signed)
Patient ID: Brandy Ortega MRN: [id number], DOB: Jun 10, 1976, 37 y.o. Date of Encounter: 10/29/2012, 7:31 AM    Chief Complaint: Pain on side of left hip   HPI: 37 y.o. year old female states that she first noticed this discomfort on Wed. March March 12th  --6 days ago. Noticed one focal area with a "burn/tingle" sensation. When leaned against something, "it felt like it was bruised but no bruise was there when I looked."  The next day, when taking off her pants, felt that burning sensation.  Has developed no rash at all. Has had no trauma or injury to the area.      Home Meds: Current Outpatient Prescriptions on File Prior to Visit  Medication Sig Dispense Refill  . Cholecalciferol (VITAMIN D) 2000 UNITS tablet Take 2,000 Units by mouth daily.      . fish oil-omega-3 fatty acids 1000 MG capsule Take 2 g by mouth daily.      Marland Kitchen levothyroxine (SYNTHROID, LEVOTHROID) 50 MCG tablet Take 50 mcg by mouth every other day.      . levothyroxine (SYNTHROID, LEVOTHROID) 75 MCG tablet Take 75 mcg by mouth every other day.      . Multiple Vitamin (MULTIVITAMIN) tablet Take 1 tablet by mouth daily.       No current facility-administered medications on file prior to visit.    Allergies: No Known Allergies    Review of Systems: Constitutional: negative for chills, fever, night sweats, weight changes, or fatigue  HEENT: negative for vision changes, hearing loss, congestion, rhinorrhea, ST, epistaxis, or sinus pressure Cardiovascular: negative for chest pain or palpitations Respiratory: negative for hemoptysis, wheezing, shortness of breath, or cough Abdominal: negative for abdominal pain, nausea, vomiting, diarrhea, or constipation Dermatological: negative for rash Neurologic: negative for headache, dizziness, or syncope    Physical Exam: Blood pressure 104/70, pulse 68, temperature 98.8 F (37.1 C), temperature source Oral, resp. rate 18, height 5' 5.5" (1.664 m), weight 159 lb  (72.122 kg)., Body mass index is 26.05 kg/(m^2). General: Well developed, well nourished, in no acute distress. HEENT: Normocephalic, atraumatic, eyes without discharge, sclera non-icteric, nares are without discharge. Bilateral auditory canals clear, TM's are without perforation, pearly grey and translucent with reflective cone of light bilaterally. Oral cavity moist, posterior pharynx without exudate, erythema, peritonsillar abscess, or post nasal drip.  Neck: Supple. No thyromegaly. Full ROM. No lymphadenopathy. Lungs: Clear bilaterally to auscultation without wheezes, rales, or rhonchi. Breathing is unlabored. Heart: RRR with S1 S2. No murmurs, rubs, or gallops appreciated. Abdomen: Soft, non-tender, non-distended with normoactive bowel sounds. No hepatomegaly. No rebound/guarding. No obvious abdominal masses. Msk:  Strength and tone normal for age. Extremities/Skin: Left lateral hip: There is a focal area that is approx one inch diameter that is hypersensitive. There is no erythema, warmth, or lesions at the site. No ecchymosis. ROM of hip nml. Neuro: Alert and oriented X 3. Moves all extremities spontaneously. Gait is normal. CNII-XII grossly in tact. Psych:  Responds to questions appropriately with a normal affect.   Labs:   ASSESSMENT AND PLAN:  37 y.o. year old female with Burning/ Tingling sensation of left lateral hip. Discussed that it sounds like an inflamed nerve. She does not want Rx med for neuropathic pain. Recommend OTC NSAID. Discussed imaging, nerve conduction studies, etc. She is a Publishing rights manager. We discussed this together, decided to get a Left Hip XRay, then f/u if abnormal or if pain not resolved in 2 weeks. -  Signed, Frazier Richards,  PA, [facility] 10/29/2012 7:31 AM

## 2012-11-10 ENCOUNTER — Encounter: Payer: Self-pay | Admitting: Family Medicine

## 2012-11-14 ENCOUNTER — Telehealth: Payer: Self-pay | Admitting: Family Medicine

## 2012-11-14 MED ORDER — LEVOTHYROXINE SODIUM 25 MCG PO TABS: 25.0000 ug | ORAL_TABLET | Freq: Every day | ORAL | Status: DC

## 2012-11-14 NOTE — Telephone Encounter (Signed)
Yes,  Take half of 25 mcg levothyroxine per day

## 2012-11-14 NOTE — Telephone Encounter (Signed)
rx refilled and pt aware per vm

## 2013-01-02 ENCOUNTER — Encounter: Payer: Self-pay | Admitting: Family Medicine

## 2013-01-02 ENCOUNTER — Ambulatory Visit (INDEPENDENT_AMBULATORY_CARE_PROVIDER_SITE_OTHER): Payer: 59 | Admitting: Family Medicine

## 2013-01-02 VITALS — BP 110/68 | HR 76 | Temp 98.1°F | Resp 14 | Wt 156.0 lb

## 2013-01-02 DIAGNOSIS — M2550 Pain in unspecified joint: Secondary | ICD-10-CM

## 2013-01-02 LAB — CBC WITH DIFFERENTIAL/PLATELET
Eosinophils Absolute: 0.2 10*3/uL (ref 0.0–0.7)
Eosinophils Relative: 4 % (ref 0–5)
HCT: 38.1 % (ref 36.0–46.0)
Hemoglobin: 12.7 g/dL (ref 12.0–15.0)
Lymphocytes Relative: 36 % (ref 12–46)
Lymphs Abs: 1.5 10*3/uL (ref 0.7–4.0)
MCH: 29.3 pg (ref 26.0–34.0)
MCV: 87.8 fL (ref 78.0–100.0)
Monocytes Relative: 7 % (ref 3–12)
RBC: 4.34 MIL/uL (ref 3.87–5.11)
WBC: 4.1 10*3/uL (ref 4.0–10.5)

## 2013-01-02 NOTE — Progress Notes (Signed)
  Subjective:    Patient ID: Brandy Ortega, female    DOB: 1975/09/13, 37 y.o.   MRN: [id number]  HPI  Patient reports several months of pain and stiffness in the joints of both hands. This is steadily progressed and worsened. It is particularly worse after activities that require repetitive use of the hands. Is primarily in the MCP and PIP joints. She also complains of pain and stiffness in her wrists and elbows. It is worse first thing in the morning. This usually lasts for less than an hour. It improves usually with range of motion throughout the day. It responds somewhat to over-the-counter NSAIDs. She denies any fevers, chills, redness, or joint effusion. She denies any recent weight loss. She has no family history of autoimmune diseases. Past Medical History  Diagnosis Date  . Ovarian cyst   . Chiari I malformation   . DSAP (disseminated superficial actinic porokeratosis)   . Hypothyroid   . Vitamin D deficiency   . IBS (irritable bowel syndrome)   . Depression     Postpartum  . NSVD (normal spontaneous vaginal delivery)     X3   No current outpatient prescriptions on file.  No Known Allergies  History   Social History  . Marital Status: Married    Spouse Name: N/A    Number of Children: N/A  . Years of Education: N/A   Occupational History  . Not on file.   Social History Main Topics  . Smoking status: Never Smoker   . Smokeless tobacco: Never Used  . Alcohol Use: Yes     Comment: occasional wine  . Drug Use: No  . Sexually Active: Yes     Comment: Husband had vasectomy   Other Topics Concern  . Not on file   Social History Narrative  . No narrative on file      Review of Systems  All other systems reviewed and are negative.       Objective:   Physical Exam  Vitals reviewed. Constitutional: She appears well-developed and well-nourished.  Cardiovascular: Normal rate, regular rhythm and normal heart sounds.   No murmur  heard. Pulmonary/Chest: Effort normal and breath sounds normal. No respiratory distress. She has no wheezes. She has no rales. She exhibits no tenderness.  Abdominal: Soft. Bowel sounds are normal. She exhibits no distension. There is no tenderness. There is no rebound.  Musculoskeletal:       Right hand: She exhibits normal range of motion, no tenderness, no bony tenderness and no deformity. Normal strength noted. She exhibits no finger abduction, no thumb/finger opposition and no wrist extension trouble.       Left hand: She exhibits normal range of motion, no tenderness, no bony tenderness and no deformity. Normal strength noted. She exhibits no finger abduction, no thumb/finger opposition and no wrist extension trouble.  Skin: Skin is warm. Rash noted. There is erythema.   she has wide spread DSAP on her arms.        Assessment & Plan:  1. Polyarthralgia Begin drawing labs to look for autoimmune arthritides.  If lab values are normal, obtain x-rays of both hands to look for joint erosions or signs of osteoarthritis. - Sedimentation rate - Rheumatoid factor - CBC with Differential - ANA

## 2013-11-09 ENCOUNTER — Other Ambulatory Visit: Payer: Self-pay | Admitting: Obstetrics and Gynecology

## 2013-11-09 DIAGNOSIS — N6452 Nipple discharge: Secondary | ICD-10-CM

## 2013-11-09 DIAGNOSIS — N63 Unspecified lump in unspecified breast: Secondary | ICD-10-CM

## 2013-11-16 ENCOUNTER — Other Ambulatory Visit: Payer: 59

## 2013-11-17 ENCOUNTER — Other Ambulatory Visit: Payer: Self-pay | Admitting: Internal Medicine

## 2013-11-17 DIAGNOSIS — E049 Nontoxic goiter, unspecified: Secondary | ICD-10-CM

## 2013-11-17 DIAGNOSIS — R1310 Dysphagia, unspecified: Secondary | ICD-10-CM

## 2013-11-23 ENCOUNTER — Ambulatory Visit
Admission: RE | Admit: 2013-11-23 | Discharge: 2013-11-23 | Disposition: A | Discharge status: Home / Self Care | Payer: 59 | Source: Ambulatory Visit | Attending: Obstetrics and Gynecology | Admitting: Obstetrics and Gynecology

## 2013-11-23 DIAGNOSIS — N63 Unspecified lump in unspecified breast: Secondary | ICD-10-CM

## 2013-11-23 DIAGNOSIS — N6452 Nipple discharge: Secondary | ICD-10-CM

## 2013-11-25 ENCOUNTER — Other Ambulatory Visit: Payer: 59

## 2013-12-03 ENCOUNTER — Ambulatory Visit
Admission: RE | Admit: 2013-12-03 | Discharge: 2013-12-03 | Disposition: A | Discharge status: Home / Self Care | Payer: 59 | Source: Ambulatory Visit | Attending: Internal Medicine | Admitting: Internal Medicine

## 2013-12-03 DIAGNOSIS — E049 Nontoxic goiter, unspecified: Secondary | ICD-10-CM

## 2013-12-03 DIAGNOSIS — R1310 Dysphagia, unspecified: Secondary | ICD-10-CM

## 2013-12-23 ENCOUNTER — Other Ambulatory Visit (INDEPENDENT_AMBULATORY_CARE_PROVIDER_SITE_OTHER): Payer: Self-pay | Admitting: Otolaryngology

## 2013-12-23 DIAGNOSIS — R1310 Dysphagia, unspecified: Secondary | ICD-10-CM

## 2013-12-29 ENCOUNTER — Ambulatory Visit (HOSPITAL_COMMUNITY): Payer: 59

## 2014-03-10 ENCOUNTER — Other Ambulatory Visit: Payer: Self-pay | Admitting: Endocrinology

## 2014-03-10 DIAGNOSIS — E049 Nontoxic goiter, unspecified: Secondary | ICD-10-CM

## 2014-08-25 ENCOUNTER — Ambulatory Visit
Admission: RE | Admit: 2014-08-25 | Discharge: 2014-08-25 | Disposition: A | Discharge status: Home / Self Care | Payer: 59 | Source: Ambulatory Visit | Attending: Endocrinology | Admitting: Endocrinology

## 2014-08-25 DIAGNOSIS — E049 Nontoxic goiter, unspecified: Secondary | ICD-10-CM

## 2015-08-14 HISTORY — PX: CERVICAL SPINE SURGERY: SHX589

## 2015-11-09 ENCOUNTER — Other Ambulatory Visit: Payer: Self-pay | Admitting: Obstetrics and Gynecology

## 2016-07-04 ENCOUNTER — Ambulatory Visit: Admit: 2016-07-04 | Payer: Self-pay | Admitting: Obstetrics and Gynecology

## 2016-07-04 SURGERY — OOPHORECTOMY, LAPAROSCOPIC
Anesthesia: General

## 2017-01-08 DIAGNOSIS — E559 Vitamin D deficiency, unspecified: Secondary | ICD-10-CM | POA: Diagnosis not present

## 2017-01-08 DIAGNOSIS — N926 Irregular menstruation, unspecified: Secondary | ICD-10-CM | POA: Diagnosis not present

## 2017-01-08 DIAGNOSIS — E039 Hypothyroidism, unspecified: Secondary | ICD-10-CM | POA: Diagnosis not present

## 2017-02-12 DIAGNOSIS — H1010 Acute atopic conjunctivitis, unspecified eye: Secondary | ICD-10-CM | POA: Diagnosis not present

## 2017-05-02 ENCOUNTER — Encounter (HOSPITAL_COMMUNITY): Payer: Self-pay | Admitting: Emergency Medicine

## 2017-05-02 ENCOUNTER — Emergency Department (HOSPITAL_COMMUNITY): Payer: No Typology Code available for payment source

## 2017-05-02 ENCOUNTER — Emergency Department (HOSPITAL_COMMUNITY)
Admission: EM | Admit: 2017-05-02 | Discharge: 2017-05-02 | Disposition: A | Discharge status: Home / Self Care | Payer: No Typology Code available for payment source | Attending: Emergency Medicine | Admitting: Emergency Medicine

## 2017-05-02 DIAGNOSIS — Z79899 Other long term (current) drug therapy: Secondary | ICD-10-CM | POA: Insufficient documentation

## 2017-05-02 DIAGNOSIS — Y929 Unspecified place or not applicable: Secondary | ICD-10-CM | POA: Diagnosis not present

## 2017-05-02 DIAGNOSIS — S199XXA Unspecified injury of neck, initial encounter: Secondary | ICD-10-CM | POA: Diagnosis present

## 2017-05-02 DIAGNOSIS — Y999 Unspecified external cause status: Secondary | ICD-10-CM | POA: Diagnosis not present

## 2017-05-02 DIAGNOSIS — S161XXA Strain of muscle, fascia and tendon at neck level, initial encounter: Secondary | ICD-10-CM | POA: Diagnosis not present

## 2017-05-02 DIAGNOSIS — T148XXA Other injury of unspecified body region, initial encounter: Secondary | ICD-10-CM | POA: Diagnosis not present

## 2017-05-02 DIAGNOSIS — R51 Headache: Secondary | ICD-10-CM | POA: Diagnosis not present

## 2017-05-02 DIAGNOSIS — Y9389 Activity, other specified: Secondary | ICD-10-CM | POA: Insufficient documentation

## 2017-05-02 DIAGNOSIS — M542 Cervicalgia: Secondary | ICD-10-CM | POA: Diagnosis not present

## 2017-05-02 NOTE — ED Notes (Signed)
Patient transported to CT 

## 2017-05-02 NOTE — Discharge Instructions (Signed)
Use ice on the sore spots for 2 days after that use heat  Take Tylenol or Motrin as needed for pain

## 2017-05-02 NOTE — ED Notes (Signed)
Pt returns from ct scan. Family at bedside.

## 2017-05-02 NOTE — ED Notes (Signed)
Pt verbalized understanding of d/c instructions and has no further questions. Pt stable and NAD. VSS. Pt removed all belongings from room.
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
## 2017-06-25 DIAGNOSIS — N926 Irregular menstruation, unspecified: Secondary | ICD-10-CM | POA: Diagnosis not present

## 2017-06-25 DIAGNOSIS — R0683 Snoring: Secondary | ICD-10-CM | POA: Diagnosis not present

## 2017-06-25 DIAGNOSIS — E039 Hypothyroidism, unspecified: Secondary | ICD-10-CM | POA: Diagnosis not present

## 2017-08-15 ENCOUNTER — Encounter: Payer: Self-pay | Admitting: Neurology

## 2017-08-19 ENCOUNTER — Ambulatory Visit: Payer: 59 | Admitting: Neurology

## 2017-08-19 ENCOUNTER — Encounter: Payer: Self-pay | Admitting: Neurology

## 2017-08-19 VITALS — BP 121/80 | HR 68 | Ht 66.0 in | Wt 163.0 lb

## 2017-08-19 DIAGNOSIS — F5109 Other insomnia not due to a substance or known physiological condition: Secondary | ICD-10-CM | POA: Diagnosis not present

## 2017-08-19 DIAGNOSIS — G4753 Recurrent isolated sleep paralysis: Secondary | ICD-10-CM

## 2017-08-19 DIAGNOSIS — G4719 Other hypersomnia: Secondary | ICD-10-CM | POA: Diagnosis not present

## 2017-08-19 DIAGNOSIS — R442 Other hallucinations: Secondary | ICD-10-CM | POA: Diagnosis not present

## 2017-08-19 DIAGNOSIS — F519 Sleep disorder not due to a substance or known physiological condition, unspecified: Secondary | ICD-10-CM | POA: Insufficient documentation

## 2017-08-19 NOTE — Progress Notes (Signed)
SLEEP MEDICINE CLINIC   Provider:  Larey Seat, M D  Primary Care Physician:  Glendale Chard, MD Referring Provider:   Chief Complaint  Patient presents with  . New Patient (Initial Visit)    pt alone, rm 10. pt states that her sleep is terrible. pt states that she gasp for air, she snorts and wakes up gasping for air and has a hard time catching her breath. doing this makes her anxious and sometimes causes a panic attack.     HPI:  Brandy Ortega is a 42 y.o. female , seen here in a referral  from Dr.  Baird Cancer  for a sleep evaluation.   Brandy Ortega is a 42 year old married Caucasian female patient who has for the last 6 years taking progesterone supplements attempt to improve her nocturnal sleep pattern.  The patient is not menopausal and has regular menses. She did not require estrogen supplementation at the time.  She reports that she has fragmented sleep and excessive daytime sleepiness which is also reflected in an Epworth sleepiness score of 16 points.  She carries a diagnosis of scoliosis, cervical disc disease and hypothyroidism.  She is on regular Synthroid medication compliance with that.  In Oct 11, 2015 she underwent cervical discectomy- disc was replacement with an artificial steel disc implanted at C6-7. Dr. Renea Ee.   Chief complaint according to patient : " Fragmented sleep and EDS"  Sleep habits are as follows: She usually goes to bed between 10.30 -12.30 PM depending on her daily workload.  For the last hour before going to bed she may read, fold laundry or clean up or watch TV.  She does watch TV in the bedroom, her husband likes to watch TV. Whenever she may wake up throughout the night the TV is on.  Her husband used to be a night shift Insurance underwriter. She usually sleeps first on her back but then rolls to either side, but she does never sleep prone.  He uses one pillow for head support and 1 for body support. She reports that while she has no trouble falling asleep  she feels highly anxious, and she wakes up frequently.  She usually is awake by 130 for the first time, it is not nocturia that causes these arousals.  She may go back to sleep and then again wakes up spontaneously around 4 AM and then again around 7 AM.  Does not remember vivid dreams.  She does not usually experience nightmares or night terrors and she is not a sleep walker. She states whenever she wakes up during the night  it takes her a while to reorient herself and to accept that she is right now not asleep but awake.  He often wakes up with a choking sensation or by a choking sensation, feeling air hungry and short of breath.  Isolated incidence of sleep paralysis has happened and she sometimes noticed auditory hallucinations when falling asleep, but she does not correlate these to vivid dreams.  Is been has not commented on her to snore or has not witnessed apneas.   Sleep medical history and family sleep history:   Father had OSA, died in Oct 10, 2010 of prostrate cancer with bone mets. Mother snores. One sister- no known breathing  sleep disorder, her sister is awake all night with a reversed circadian rhythm.  Social history: The patient is married, her husband used to be a Medical illustrator and is now employed in daytime, she has 4 children between the ages of 43 and 73.  The patient does not use tobacco, may have 1 glass of wine once a week, drinks 1 cup of coffee in the morning, decaffeinated hot tea throughout the day.  No sodas or energy drinks are consumed. 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  Review of Systems: Out of a complete 14 system review, the patient complains of only the following symptoms, and all other reviewed systems are negative.    Snorting, grunting, sleep fragmentation.   How likely are you to doze in the following situations: 0 = not likely, 1 = slight chance, 2 = moderate chance, 3  = high chance  Sitting and Reading? 2 Watching Television?3 Sitting inactive in a public place (theater or meeting)?2 Lying down in the afternoon when circumstances permit?1 Sitting and talking to someone? 3 Sitting quietly after lunch without alcohol? 1 In a car, while stopped for a few minutes in traffic? 3 As a passenger in a car for an hour without a break?1  Total =  16   Fatigue severity score 44 points ,  Depression score n/a   Social History   Socioeconomic History  . Marital status: Married    Spouse name: Not on file  . Number of children: Not on file  . Years of education: Not on file  . Highest education level: Not on file  Social Needs  . Financial resource strain: Not on file  . Food insecurity - worry: Not on file  . Food insecurity - inability: Not on file  . Transportation needs - medical: Not on file  . Transportation needs - non-medical: Not on file  Occupational History  . Not on file  Tobacco Use  . Smoking status: Never Smoker  . Smokeless tobacco: Never Used  Substance and Sexual Activity  . Alcohol use: Yes    Comment: occasional wine  . Drug use: No  . Sexual activity: Yes    Comment: Husband had vasectomy  Other Topics Concern  . Not on file  Social History Narrative  . Not on file    Family History  Problem Relation Age of Onset  . COPD Mother   . Hypertension Mother   . Hypothyroidism Mother   . Cancer Father        adrenal  . Asthma Sister   . Hypertension Sister   . Mental illness Sister   . Stroke Maternal Grandfather   . Heart disease Maternal Grandfather   . Diabetes Paternal Grandmother   . Diabetes Paternal Grandfather   . Heart disease Paternal Grandfather     Past Medical History:  Diagnosis Date  . Chiari I malformation ([place])   . Depression    Postpartum  . DSAP (disseminated superficial actinic porokeratosis)   . Generalized anxiety disorder   . Heart murmur   . Hypothyroid   . IBS (irritable bowel  syndrome)   . NSVD (normal spontaneous vaginal delivery)    X3  . Ovarian cyst   . Palpitations   . Vitamin D deficiency     Past Surgical History:  Procedure Laterality Date  . BACK SURGERY    . OVARIAN CYST REMOVAL    . WISDOM TOOTH EXTRACTION      Current Outpatient Medications  Medication Sig Dispense Refill  . albuterol (PROAIR HFA) 108 (90 Base) MCG/ACT inhaler Take 2 puffs by mouth every 6 (six) hours as needed for wheezing or shortness of breath.     Marland Kitchen  Ascorbic Acid (VITAMIN C) 1000 MG tablet Take 1,000 mg by mouth daily.    . Cholecalciferol (VITAMIN D3) 2000 units TABS Take by mouth.    . Cyanocobalamin (VITAMIN B 12 PO) Take by mouth.    . FIRST-PROGESTERONE VGS 50 VA Take 50 mg by mouth at bedtime. COMPOUNDED AT [facility]    . ibuprofen (ADVIL,MOTRIN) 200 MG tablet Take 800 mg by mouth every 6 (six) hours as needed for headache or mild pain.    Marland Kitchen levothyroxine (SYNTHROID, LEVOTHROID) 75 MCG tablet Take 75 mcg by mouth See admin instructions. Monday thru thursday    . levothyroxine (SYNTHROID, LEVOTHROID) 88 MCG tablet Take 88 mcg by mouth See admin instructions. Friday thru sunday    . magnesium gluconate (MAGONATE) 500 MG tablet Take 500 mg by mouth at bedtime.    . Theanine 100 MG CAPS Take 100 mg by mouth at bedtime.    . valACYclovir (VALTREX) 500 MG tablet Take 500 mg by mouth daily as needed.     Marland Kitchen VITAMIN D, CHOLECALCIFEROL, PO Take 7,000 Units by mouth every other day.     No current facility-administered medications for this visit.     Allergies as of 08/19/2017 - Review Complete 08/19/2017  Allergen Reaction Noted  . No known allergies      Vitals: BP 121/80   Pulse 68   Ht 5' 6"  (1.676 m)   Wt 163 lb (73.9 kg)   BMI 26.31 kg/m  Last Weight:  Wt Readings from Last 1 Encounters:  08/19/17 163 lb (73.9 kg)   NIO:EVOJ mass index is 26.31 kg/m.     Last Height:   Ht Readings from Last 1 Encounters:  08/19/17 5' 6"  (1.676 m)     Physical exam:  General: The patient is awake, alert and appears not in acute distress. The patient is well groomed. Head: Normocephalic, atraumatic. Neck is supple. Mallampati 2 neck circumference:13.5 ". Nasal airflow patent , Retrognathia is not seen.  Cardiovascular:  Regular rate and rhythm, without  murmurs or carotid bruit, and without distended neck veins. Respiratory: Lungs are clear to auscultation. Skin:  Without evidence of edema, or rash Trunk: BMI is normal . The patient's posture is erect.   Neurologic exam : The patient is awake and alert, oriented to place and time.   Memory subjective described as intact.  Attention span & concentration ability appears normal.  Speech is fluent,  without dysarthria, dysphonia or aphasia.  Mood and affect are appropriate.  Cranial nerves: Pupils are equal and briskly reactive to light. Funduscopic exam without evidence of pallor or edema. Extraocular movements  in vertical and horizontal planes intact and without nystagmus. Visual fields by finger perimetry are intact.Hearing to finger rub intact.  Facial sensation intact to fine touch. Facial motor strength is symmetric and tongue and uvula move midline. Shoulder shrug was symmetrical.   Motor exam:  Normal tone, muscle bulk and symmetric strength in all extremities. Sensory:  Fine touch, pinprick and vibration were tested in all extremities. Proprioception tested in the upper extremities was normal. Coordination: Rapid alternating movements in the fingers/hands was normal. Finger-to-nose maneuver  normal without evidence of ataxia, dysmetria or tremor. Gait and station: Patient walks without assistive device . Strength within normal limits. Stance is stable and normal. Turns with 3 Steps. Romberg testing is negative. Deep tendon reflexes: in the  upper and lower extremities are symmetric and intact.   Assessment:  After physical and neurologic examination, review of  laboratory  studies,  Personal review of imaging studies, reports of other /same  Imaging studies, results of polysomnography and / or neurophysiology testing and pre-existing records as far as provided in visit., my assessment is   1) I am not sure that Mrs. Baumgart has an organic sleep disorder, but our goal is to eliminate that possibility.  For this reason we will order a sleep study to look at respiratory disturbance index, heart rate abnormalities, hypoxemia and frank apnea.  If a home sleep test does not reveal the presence of either I would just recommend to try melatonin usually does not help to fall asleep to sleep longer.  In addition we will discussed some sleep hygiene issues, such as sit TV not being in the bedroom as light and sounds are frequently a cause of nocturnal arousals.  If Mr. Eichenberger feels that he requires this kind of sound background, I would like for him to try and audiobook or relaxing quiet music in the background, thus eliminating the need for additional light interference. I have also given the patient a booklet about insomnia Will not prescribe any medication at this time, down the road we may have to discuss prevention.  I do feel that the degree of daytime sleepiness and the high degree of fatigue justified ordering a sleep study.   [place].   The patient was advised of the nature of the diagnosed disorder , the treatment options and the  risks for general health and wellness arising from not treating the condition.   I spent more than 45 minutes of face to face time with the patient. Greater than 50% of time was spent in counseling and coordination of care. We have discussed the diagnosis and differential and I answered the patient's questions.    Plan:  Treatment plan and additional workup :    HST to rule out apnea, and look from there if further evaluation is needed.    Larey Seat, MD 02/15/7971, 8:20 PM  Certified in Neurology by ABPN Certified  in [place] by [facility] [address] [place], [place] [address]

## 2017-08-19 NOTE — Patient Instructions (Signed)
I believe you may have a condition called narcolepsy: This means, that you could have a sleep disorder that manifests with  Severe/ excessive sleepiness during the day and often with problems with continuous sleep at night. We have a special test , PSG with MSLT, to evaluate.    We may have to try different medications that may help you stay awake during the day. Not everything works with everybody the same way. Wake promoting agents include stimulants and non-stimulant type medications. The most common side effects with stimulants are weight loss, insomnia, nervousness, headaches, palpitations, rise in blood pressure, anxiety. Stimulants can be addictive and subject to abuse. Non-stimulant type wake promoting medications include Provigil and Nuvigil, most common side effects include headaches, nervousness, insomnia, hypertension. In addition there is a medication called Xyrem which has been proven to be very effective in patients with narcolepsy with or without cataplexy. Some patients with narcolepsy report episodes of weakness, such as jaw or facial weakness, legs giving out, feeling wobbly or like "Jell-o", etc. in situations of anxiety, stress, laughter, sudden sadness, surprise, etc., which is called cataplexy. You can also experience episodes of sleep paralysis during which you may feel unable to move upon awakening. Some people experience dreamlike sequences upon awakening or upon drifting off to sleep, called hypnopompic or hypnagogic hallucinations.

## 2017-10-10 DIAGNOSIS — J208 Acute bronchitis due to other specified organisms: Secondary | ICD-10-CM | POA: Diagnosis not present

## 2017-10-10 DIAGNOSIS — B338 Other specified viral diseases: Secondary | ICD-10-CM | POA: Diagnosis not present

## 2017-10-10 DIAGNOSIS — R5383 Other fatigue: Secondary | ICD-10-CM | POA: Diagnosis not present

## 2017-10-16 DIAGNOSIS — J0110 Acute frontal sinusitis, unspecified: Secondary | ICD-10-CM | POA: Diagnosis not present

## 2017-10-16 DIAGNOSIS — B338 Other specified viral diseases: Secondary | ICD-10-CM | POA: Diagnosis not present

## 2017-10-21 ENCOUNTER — Ambulatory Visit (INDEPENDENT_AMBULATORY_CARE_PROVIDER_SITE_OTHER): Payer: 59 | Admitting: Neurology

## 2017-10-21 DIAGNOSIS — F519 Sleep disorder not due to a substance or known physiological condition, unspecified: Secondary | ICD-10-CM

## 2017-10-21 DIAGNOSIS — G4710 Hypersomnia, unspecified: Secondary | ICD-10-CM | POA: Diagnosis not present

## 2017-10-21 DIAGNOSIS — G4753 Recurrent isolated sleep paralysis: Secondary | ICD-10-CM

## 2017-10-21 DIAGNOSIS — G4719 Other hypersomnia: Secondary | ICD-10-CM

## 2017-10-21 DIAGNOSIS — F5109 Other insomnia not due to a substance or known physiological condition: Secondary | ICD-10-CM

## 2017-10-21 DIAGNOSIS — R442 Other hallucinations: Secondary | ICD-10-CM

## 2017-10-22 ENCOUNTER — Ambulatory Visit (INDEPENDENT_AMBULATORY_CARE_PROVIDER_SITE_OTHER): Payer: 59 | Admitting: Neurology

## 2017-10-22 DIAGNOSIS — F5109 Other insomnia not due to a substance or known physiological condition: Secondary | ICD-10-CM

## 2017-10-22 DIAGNOSIS — G4753 Recurrent isolated sleep paralysis: Secondary | ICD-10-CM | POA: Diagnosis not present

## 2017-10-22 DIAGNOSIS — F519 Sleep disorder not due to a substance or known physiological condition, unspecified: Secondary | ICD-10-CM

## 2017-10-22 DIAGNOSIS — R442 Other hallucinations: Secondary | ICD-10-CM

## 2017-10-22 DIAGNOSIS — G4719 Other hypersomnia: Secondary | ICD-10-CM

## 2017-10-23 NOTE — Procedures (Signed)
  Name:  Brandy Ortega, Glaspy Reference [id number]     Study Date: 10/22/2017 Procedure #: [id number]  DOB: 1976/01/24    Protocol  This is a 13 channel Multiple Sleep Latency Test comprised of 5 channels of EEG (T3-Cz, Cz-T4, F4-M1, C4-M1, O2-M1), 3 channels of Chin EMG, 4 channels of EOG and 1 channel for ECG.   All channels were sampled at 256hz .    This polysomnographic procedure is designed to evaluate (1) the complaint of excessive daytime sleepiness by quantifying the time required to fall asleep and (2) the possibility of narcolepsy by checking for abnormally short latencies to REM sleep.  Electrographic variables include EEG, EMG, EOG and ECG.  Patients are monitored throughout four or five 20-minute opportunities to sleep (naps) at two-hour intervals.  For each nap, the patient is allowed 20 minutes to fall asleep.  Once asleep, the patient is awakened after 15 minutes.  Between naps, the patient is kept as alert as possible.  A sleep latency of 20 minutes indicates that no sleep occurred.  Parametric Analysis  Total Number of Naps 5     NAP # Time of Nap  Sleep Latency (mins) REM Latency (mins) Sleep Time Percent Awake Time Percent  1 08:09 20 0 0 100   2 09:59 10.5 0 53  47   3 12:01 20 0 0  100   4 13:49 8 0 36  64   5 15:48 20 0 0  100    MSLT Summary of Naps  Sleepiness Index: 21.5  Mean Sleep Latency to all Five Naps: 15.7  Mean Sleep Latency to First Four Naps: 14.6  Mean Sleep Latency to First Three Naps: 16.8  Mean Sleep Latency to First Two Naps: 15.25  Number of Naps with REM Sleep: 0    Results from Preceding PSG Study  Sleep Onset Time 23:14 Sleep Efficiency (%) 74.6  Rise Time 06.33 Sleep Latency (min) 40.5  Total Sleep Time  362 REM Latency (min) 97         IMPRESSION:  1. This multiple sleep latency test reveals a mean sleep latency of 15.7 minutes with 0 sleep periods during which REM sleep was recorded. 2. A total of 5 nap opportunities  were offered, only in 3 of 5 nap opportunities was any sleep recorded.   3. This study preceded an overnight polysomnogram with a total sleep time (TST) of 362 minutes and REM latency of 97 minutes.       I attest to having reviewed every epoch of the entire raw data recording prior to the issuance of this report in accordance with the Standards of the [place] of Sleep Medicine.     Name:  Natania, Finigan Reference #:  [id number]  Study Date: 10/22/2017 DOB: 1976-04-26    RECOMMENDATIONS: This is a normal MSLT study. This study does not support a diagnosis of hypersomnolence and is inconsistent with a diagnosis of narcolepsy.     Larey Seat, M.D.     10-23-2017  Diplomat, the American Board of Psychiatry and Neurology  Diplomat,  The American Board of Sleep Medicine Medical Director of [place] [facility] at [facility]

## 2017-10-23 NOTE — Procedures (Signed)
PATIENT'S NAME:  Brandy Ortega, Brandy Ortega DOB:      August 03, 1976      MR#:    [id number]     DATE OF RECORDING: 10/21/2017 REFERRING M.D.:  Jenna Luo, M.D. Study Performed:   Baseline Polysomnogram HISTORY:   42 year old female patient with non- restorative, fragmented sleep. Poor sleep habits (TV on all night in the bedroom) and hypersomnia, endorsing the Epworth sleepiness score at 16 points. She reports sleep choking, carries a diagnosis of  cervical spinal fusion, artificial disk implant, asthma, Arnold Chiari 1 malformation, IBS, anxiety, scoliosis, and hypothyroidism. The patient endorsed the Epworth Sleepiness Scale at 16 points, the FSS at 44/ 63 points.   The patient's weight 163 pounds with a height of 66 (inches), resulting in a BMI of 26.2 kg/m2. The patient's neck circumference measured 13.5 inches.  CURRENT MEDICATIONS: ProAir inhaler, Vitamin C, D3, and B 12, Progesterone, Synthroid, and Valtrex.   PROCEDURE:  This is a multichannel digital polysomnogram utilizing the Somnostar 11.2 system.  Electrodes and sensors were applied and monitored per AASM Specifications.   EEG, EOG, Chin and Limb EMG, were sampled at 200 Hz.  ECG, Snore and Nasal Pressure, Thermal Airflow, Respiratory Effort, CPAP Flow and Pressure, Oximetry was sampled at 50 Hz. Digital video and audio were recorded.      BASELINE STUDY: Lights Out was at 22:29 and Lights On at 06:33.  Total recording time (TRT) was 485 minutes, with a total sleep time (TST) of 362 minutes.   The patient's sleep latency was prolonged at 65.5 minutes.  REM latency was 97 minutes. The sleep efficiency was 74.6 %.     SLEEP ARCHITECTURE: WASO (Wake after sleep onset) was 82.5 minutes.  There were 20 minutes in Stage N1, 224 minutes Stage N2, 74 minutes Stage N3 and 44 minutes in Stage REM.  The percentage of Stage N1 was 5.5%, Stage N2 was 61.9%, Stage N3 was 20.4% and Stage R (REM sleep) was 12.2%.   RESPIRATORY ANALYSIS:  There were a  total of 0 respiratory events. The patient also had 0 respiratory event related arousals (RERAs).     The total APNEA/HYPOPNEA INDEX (AHI) was 0.0/hour in REM sleep and 0 events in NREM. The patient spent 278.5 minutes of total sleep time in the supine position and 84 minutes in non-supine.  OXYGEN SATURATION & C02:  The Wake baseline 02 saturation was 97%, with the lowest being 89%. Time spent below 89% saturation equaled 0 minutes.   PERIODIC LIMB MOVEMENTS:  The patient had a total of 0 Periodic Limb Movements. The arousals were noted as: 48 were spontaneous, 0 were associated with PLMs, and none (0) were associated with respiratory events. Sleep was fragmented and sleep onset delayed.  Audio and video analysis did not show any abnormal or unusual movements, behaviors, phonations or vocalizations.   The patient took one bathroom break at 23.30 hours.No snoring was noted.EKG was in keeping with normal sinus rhythm (NSR). EEG was normal for sleep stage and age.    Post-study, the patient indicated that sleep was less sound than usual- and that she was nauseated and anxious.    IMPRESSION: Notable were difficulties with sleep onset and difficulties sustaining sleep.  No organic reason for these findings was identified. Study is valid for MSLT to follow.   1. A follow up appointment will be scheduled in the Sleep Clinic at [facility]. The referring provider will be notified of the results.  I certify that I have reviewed the entire raw data recording prior to the issuance of this report in accordance with the Standards of Accreditation of the American Academy of Sleep Medicine (AASM)    Larey Seat, MD        10-23-2017  Diplomat, American Board of Psychiatry and Neurology  Diplomat, American Board of [place] Director, [facility] [employer]

## 2017-10-24 ENCOUNTER — Telehealth: Payer: Self-pay | Admitting: Neurology

## 2017-10-24 NOTE — Telephone Encounter (Signed)
Called the patient to make her aware of the sleep study. I informed her that Dr Brett Fairy states that the night study was normal and allowed [place] to continue to do the day time test. The day test was also read as a normal MSLT and didn't support the diagnosis of hypersomnolence and is inconsistant with narcolepsy diagnosis. I offered the patient a 8:30 work in slot in may and the patient was unable to take a 8:30 am needing a afternoon apt. The next thing I had was end of June and the dates I offered she was unavailable to take. I have insrtucted the patient to call back when she has the time to reschedule to talk about next step. Pt is aware that if she is requesting an afternoon apt it would be end of June or July at this time is the earliest she can be seen.

## 2017-10-24 NOTE — Telephone Encounter (Signed)
-----   Message from Larey Seat, MD sent at 10/23/2017  6:05 PM EDT ----- Normal MSLT ( sleep latency over 15 minutes, no REM sleep onset) and normal REM latency in preceeding PSG- not reflecting hypersomnia, nor narcolepsy.    Cc Dr Baird Cancer

## 2017-11-12 DIAGNOSIS — Z79899 Other long term (current) drug therapy: Secondary | ICD-10-CM | POA: Diagnosis not present

## 2017-11-12 DIAGNOSIS — E559 Vitamin D deficiency, unspecified: Secondary | ICD-10-CM | POA: Diagnosis not present

## 2017-11-12 DIAGNOSIS — E039 Hypothyroidism, unspecified: Secondary | ICD-10-CM | POA: Diagnosis not present

## 2017-11-12 DIAGNOSIS — G4700 Insomnia, unspecified: Secondary | ICD-10-CM | POA: Diagnosis not present

## 2017-11-12 LAB — HEPATIC FUNCTION PANEL
ALT: 42 — AB (ref 7–35)
AST: 43 — AB (ref 13–35)
Alkaline Phosphatase: 73 (ref 25–125)
Bilirubin, Total: 0.5

## 2017-11-12 LAB — TSH: TSH: 1.41 (ref 0.41–5.90)

## 2017-11-12 LAB — VITAMIN D 25 HYDROXY (VIT D DEFICIENCY, FRACTURES): Vit D, 25-Hydroxy: 77.9

## 2018-05-17 ENCOUNTER — Encounter: Payer: Self-pay | Admitting: Internal Medicine

## 2018-05-17 DIAGNOSIS — N926 Irregular menstruation, unspecified: Secondary | ICD-10-CM

## 2018-05-20 ENCOUNTER — Ambulatory Visit: Payer: 59 | Admitting: Internal Medicine

## 2018-05-20 ENCOUNTER — Encounter: Payer: Self-pay | Admitting: Internal Medicine

## 2018-05-20 VITALS — BP 126/78 | HR 56 | Temp 98.2°F | Ht 65.5 in | Wt 164.0 lb

## 2018-05-20 DIAGNOSIS — R748 Abnormal levels of other serum enzymes: Secondary | ICD-10-CM

## 2018-05-20 DIAGNOSIS — Z6826 Body mass index (BMI) 26.0-26.9, adult: Secondary | ICD-10-CM

## 2018-05-20 DIAGNOSIS — Z1239 Encounter for other screening for malignant neoplasm of breast: Secondary | ICD-10-CM

## 2018-05-20 DIAGNOSIS — N926 Irregular menstruation, unspecified: Secondary | ICD-10-CM | POA: Diagnosis not present

## 2018-05-20 DIAGNOSIS — E039 Hypothyroidism, unspecified: Secondary | ICD-10-CM | POA: Diagnosis not present

## 2018-05-20 NOTE — Progress Notes (Signed)
Subjective:     Patient ID: Brandy Ortega , female    DOB: Oct 10, 1975 , 42 y.o.   MRN: [id number]   SHE IS HERE TODAY FOR F/U BHRT. SHE FEELS WELL TODAY. SHE IS DOING WELL ON HER CURRENT REGIMEN. SHE IS CURRENTLY TAKING PROGESTERONE SR CAPS 50MG NIGHTLY. HER LAST MENSES 05/01/18.  Thyroid Problem  Presents for follow-up visit. Patient reports no leg swelling, palpitations or tremors. The symptoms have been stable.     Past Medical History:  Diagnosis Date  . Chiari I malformation ([place])   . Depression    Postpartum  . DSAP (disseminated superficial actinic porokeratosis)   . Generalized anxiety disorder   . Heart murmur   . Hypothyroid   . IBS (irritable bowel syndrome)   . NSVD (normal spontaneous vaginal delivery)    X3  . Ovarian cyst   . Palpitations   . Vitamin D deficiency       Current Outpatient Medications:  .  albuterol (PROAIR HFA) 108 (90 Base) MCG/ACT inhaler, Take 2 puffs by mouth every 6 (six) hours as needed for wheezing or shortness of breath. , Disp: , Rfl:  .  Ascorbic Acid (VITAMIN C) 1000 MG tablet, Take 1,000 mg by mouth daily., Disp: , Rfl:  .  Cholecalciferol (VITAMIN D3) 2000 units TABS, Take by mouth., Disp: , Rfl:  .  Cyanocobalamin (VITAMIN B 12 PO), Take by mouth., Disp: , Rfl:  .  ibuprofen (ADVIL,MOTRIN) 200 MG tablet, Take 800 mg by mouth every 6 (six) hours as needed for headache or mild pain., Disp: , Rfl:  .  levothyroxine (SYNTHROID, LEVOTHROID) 75 MCG tablet, Take 75 mcg by mouth See admin instructions. Monday thru thursday, Disp: , Rfl:  .  levothyroxine (SYNTHROID, LEVOTHROID) 88 MCG tablet, Take 88 mcg by mouth See admin instructions. Friday thru sunday, Disp: , Rfl:  .  magnesium gluconate (MAGONATE) 500 MG tablet, Take 500 mg by mouth at bedtime., Disp: , Rfl:  .  Progesterone Micronized (PROGESTERONE COMPOUNDING KIT TD), Place onto the skin., Disp: , Rfl:  .  Theanine 100 MG CAPS, Take 100 mg by mouth at bedtime., Disp: , Rfl:   .  valACYclovir (VALTREX) 500 MG tablet, Take 500 mg by mouth daily as needed. , Disp: , Rfl:  .  VITAMIN D, CHOLECALCIFEROL, PO, Take 7,000 Units by mouth every other day., Disp: , Rfl:    Review of Systems  Cardiovascular: Negative for palpitations.  Neurological: Negative for tremors.     Today's Vitals   05/20/18 1439  BP: 126/78  Pulse: (!) 56  Temp: 98.2 F (36.8 C)  TempSrc: Oral  Weight: 164 lb (74.4 kg)  Height: 5' 5.5" (1.664 m)   Body mass index is 26.88 kg/m.   Objective:  Physical Exam  Constitutional: She is oriented to person, place, and time. She appears well-developed and well-nourished.  HENT:  Head: Normocephalic and atraumatic.  Eyes: EOM are normal.  Neck: Normal range of motion. Neck supple.  Cardiovascular: Normal rate, regular rhythm and normal heart sounds.  Pulmonary/Chest: Effort normal and breath sounds normal.  Neurological: She is alert and oriented to person, place, and time.  Psychiatric: She has a normal mood and affect.        Assessment And Plan:     Irregular menstrual cycle - PROG SR 50 QHS, REFILLS INTO CUSTOM CARE. LMP 05/01/18. SHE WILL RTO IN 4 MONTHS FOR RE-EVALUATION.   Primary hypothyroidism - I WILL CHECK A THYROID PANEL  AND ADJUST MEDS AS NEEDED.  - Plan: TSH, T4, Free  Elevated liver enzymes - I WILL CHECK REPEAT LIVER ENZYMES.  - Plan: CMP14+EGFR  BMI 26.0-26.9,adult - HER WEIGHT IS STABLE.   Breast cancer screening - I WILL REFER HER FOR ANNUAL MAMMOGRAM. - Plan: MM Digital Screening    [name], MD

## 2018-05-21 LAB — CMP14+EGFR
A/G RATIO: 1.6 (ref 1.2–2.2)
ALK PHOS: 58 IU/L (ref 39–117)
ALT: 30 IU/L (ref 0–32)
AST: 28 IU/L (ref 0–40)
Albumin: 4.1 g/dL (ref 3.5–5.5)
BUN/Creatinine Ratio: 20 (ref 9–23)
BUN: 13 mg/dL (ref 6–24)
Bilirubin Total: 0.6 mg/dL (ref 0.0–1.2)
CALCIUM: 9.3 mg/dL (ref 8.7–10.2)
CO2: 23 mmol/L (ref 20–29)
Chloride: 99 mmol/L (ref 96–106)
Creatinine, Ser: 0.66 mg/dL (ref 0.57–1.00)
GFR calc Af Amer: 125 mL/min/{1.73_m2} (ref >59)
GFR, EST NON AFRICAN AMERICAN: 109 mL/min/{1.73_m2} (ref >59)
GLOBULIN, TOTAL: 2.5 g/dL (ref 1.5–4.5)
Glucose: 80 mg/dL (ref 65–99)
Potassium: 4.2 mmol/L (ref 3.5–5.2)
Sodium: 137 mmol/L (ref 134–144)
Total Protein: 6.6 g/dL (ref 6.0–8.5)

## 2018-05-21 LAB — TSH: TSH: 2.13 u[IU]/mL (ref 0.450–4.500)

## 2018-05-21 LAB — T4, FREE: Free T4: 1.18 ng/dL (ref 0.82–1.77)

## 2018-05-21 NOTE — Progress Notes (Signed)
Here are your lab results:  Your thyroid function is within normal limits. Your liver and kidney function are normal. Please continue with current meds.   Sincerely,    Candido Flott N. Baird Cancer, MD

## 2018-05-25 ENCOUNTER — Encounter: Payer: Self-pay | Admitting: Internal Medicine

## 2018-06-16 ENCOUNTER — Ambulatory Visit: Payer: 59 | Admitting: Internal Medicine

## 2018-06-27 ENCOUNTER — Telehealth: Payer: Self-pay

## 2018-06-27 NOTE — Telephone Encounter (Signed)
The pt called and said she was supposed to have a referral for a mammogram.

## 2018-07-03 ENCOUNTER — Other Ambulatory Visit: Payer: Self-pay | Admitting: Internal Medicine

## 2018-07-03 NOTE — Telephone Encounter (Signed)
Pt said she was to have a referral mammogram.

## 2018-07-21 DIAGNOSIS — R6882 Decreased libido: Secondary | ICD-10-CM | POA: Diagnosis not present

## 2018-07-21 DIAGNOSIS — Z01419 Encounter for gynecological examination (general) (routine) without abnormal findings: Secondary | ICD-10-CM | POA: Diagnosis not present

## 2018-08-19 ENCOUNTER — Ambulatory Visit: Payer: 59 | Admitting: Internal Medicine

## 2018-08-19 ENCOUNTER — Encounter: Payer: Self-pay | Admitting: Internal Medicine

## 2018-08-19 VITALS — Temp 97.9°F | Ht 65.0 in | Wt 162.4 lb

## 2018-08-19 DIAGNOSIS — E039 Hypothyroidism, unspecified: Secondary | ICD-10-CM

## 2018-08-19 DIAGNOSIS — R002 Palpitations: Secondary | ICD-10-CM

## 2018-08-19 DIAGNOSIS — I951 Orthostatic hypotension: Secondary | ICD-10-CM | POA: Diagnosis not present

## 2018-08-19 DIAGNOSIS — R12 Heartburn: Secondary | ICD-10-CM

## 2018-08-20 LAB — BMP8+EGFR
BUN / CREAT RATIO: 15 (ref 9–23)
BUN: 9 mg/dL (ref 6–24)
CO2: 24 mmol/L (ref 20–29)
Calcium: 9.3 mg/dL (ref 8.7–10.2)
Chloride: 104 mmol/L (ref 96–106)
Creatinine, Ser: 0.59 mg/dL (ref 0.57–1.00)
GFR calc Af Amer: 130 mL/min/{1.73_m2} (ref >59)
GFR calc non Af Amer: 113 mL/min/{1.73_m2} (ref >59)
GLUCOSE: 88 mg/dL (ref 65–99)
POTASSIUM: 4.5 mmol/L (ref 3.5–5.2)
SODIUM: 141 mmol/L (ref 134–144)

## 2018-08-20 LAB — TSH: TSH: 2.7 u[IU]/mL (ref 0.450–4.500)

## 2018-08-20 NOTE — Progress Notes (Signed)
Here are your lab results:  Your thyroid function is normal. Your kidney function is normal.   I certainly hope you are feeling better today! Be sure to continue with magnesium supplementation.   Sincerely,    Gwyn Hieronymus N. Baird Cancer, MD

## 2018-08-22 ENCOUNTER — Ambulatory Visit
Admission: RE | Admit: 2018-08-22 | Discharge: 2018-08-22 | Disposition: A | Discharge status: Home / Self Care | Payer: 59 | Source: Ambulatory Visit | Attending: Internal Medicine | Admitting: Internal Medicine

## 2018-08-22 DIAGNOSIS — Z1231 Encounter for screening mammogram for malignant neoplasm of breast: Secondary | ICD-10-CM | POA: Diagnosis not present

## 2018-08-22 DIAGNOSIS — Z1239 Encounter for other screening for malignant neoplasm of breast: Secondary | ICD-10-CM

## 2018-09-02 DIAGNOSIS — N838 Other noninflammatory disorders of ovary, fallopian tube and broad ligament: Secondary | ICD-10-CM | POA: Diagnosis not present

## 2018-09-02 DIAGNOSIS — N939 Abnormal uterine and vaginal bleeding, unspecified: Secondary | ICD-10-CM | POA: Diagnosis not present

## 2018-09-07 ENCOUNTER — Encounter: Payer: Self-pay | Admitting: Internal Medicine

## 2018-09-07 NOTE — Progress Notes (Signed)
Subjective:     Patient ID: Brandy Ortega , female    DOB: [birth year] , 43 y.o.   MRN: [id number]   Chief Complaint  Patient presents with  . Dizziness    HPI  Dizziness  This is a recurrent problem. The current episode started in the past 7 days. The problem occurs 2 to 4 times per day. The problem has been unchanged. Pertinent negatives include no nausea, neck pain or vomiting. She has tried nothing for the symptoms.     Past Medical History:  Diagnosis Date  . Chiari I malformation ([place])   . Depression    Postpartum  . DSAP (disseminated superficial actinic porokeratosis)   . Generalized anxiety disorder   . Heart murmur   . Hypothyroid   . IBS (irritable bowel syndrome)   . NSVD (normal spontaneous vaginal delivery)    X3  . Ovarian cyst   . Palpitations   . Vitamin D deficiency      Family History  Problem Relation Age of Onset  . COPD Mother   . Hypertension Mother   . Hypothyroidism Mother   . Cancer Father        adrenal  . Asthma Sister   . Hypertension Sister   . Mental illness Sister   . Stroke Maternal Grandfather   . Heart disease Maternal Grandfather   . Diabetes Paternal Grandmother   . Diabetes Paternal Grandfather   . Heart disease Paternal Grandfather      Current Outpatient Medications:  .  albuterol (PROAIR HFA) 108 (90 Base) MCG/ACT inhaler, Take 2 puffs by mouth every 6 (six) hours as needed for wheezing or shortness of breath. , Disp: , Rfl:  .  Ascorbic Acid (VITAMIN C) 1000 MG tablet, Take 1,000 mg by mouth daily., Disp: , Rfl:  .  Cholecalciferol (VITAMIN D3) 2000 units TABS, Take by mouth., Disp: , Rfl:  .  ibuprofen (ADVIL,MOTRIN) 200 MG tablet, Take 800 mg by mouth every 6 (six) hours as needed for headache or mild pain., Disp: , Rfl:  .  levothyroxine (SYNTHROID, LEVOTHROID) 75 MCG tablet, Take 75 mcg by mouth See admin instructions. Monday thru thursday, Disp: , Rfl:  .  levothyroxine (SYNTHROID, LEVOTHROID) 88 MCG  tablet, Take 88 mcg by mouth See admin instructions. Friday thru sunday, Disp: , Rfl:  .  magnesium gluconate (MAGONATE) 500 MG tablet, Take 500 mg by mouth at bedtime., Disp: , Rfl:  .  Progesterone Micronized (PROGESTERONE COMPOUNDING KIT TD), Place onto the skin., Disp: , Rfl:  .  Theanine 100 MG CAPS, Take 100 mg by mouth at bedtime., Disp: , Rfl:  .  valACYclovir (VALTREX) 500 MG tablet, Take 500 mg by mouth daily as needed. , Disp: , Rfl:  .  VITAMIN D, CHOLECALCIFEROL, PO, Take 7,000 Units by mouth every other day., Disp: , Rfl:    Allergies  Allergen Reactions  . No Known Allergies      Review of Systems  Constitutional: Negative.   Respiratory: Negative.   Cardiovascular: Negative.   Gastrointestinal: Negative.  Negative for nausea and vomiting.       She c/o increased heartburn. She is not sure what triggers her sx. No nausea/vomiting.   Musculoskeletal: Negative for neck pain.  Neurological: Positive for dizziness and light-headedness.  Psychiatric/Behavioral: Negative.      Today's Vitals   08/19/18 1133  Temp: 97.9 F (36.6 C)  TempSrc: Oral  Weight: 162 lb 6.4 oz (73.7 kg)  Height: _0  (  1.651 m)   Body mass index is 27.02 kg/m.   Objective:  Physical Exam Vitals signs and nursing note reviewed.  Constitutional:      Appearance: Normal appearance.  HENT:     Head: Normocephalic and atraumatic.     Right Ear: Tympanic membrane, ear canal and external ear normal.     Left Ear: Tympanic membrane, ear canal and external ear normal.  Cardiovascular:     Rate and Rhythm: Normal rate and regular rhythm.     Heart sounds: Normal heart sounds.  Pulmonary:     Effort: Pulmonary effort is normal.     Breath sounds: Normal breath sounds.  Skin:    General: Skin is warm.  Neurological:     General: No focal deficit present.     Mental Status: She is alert.  Psychiatric:        Mood and Affect: Mood normal.         Assessment And Plan:     1. Orthostatic  hypotension  Orthostatics performed, this was positive. She is advised to increase her water intake. I think her sx are due to slight dehydration. She will let me know if her symptoms persist.   2. Heartburn  She will try OTC Pepcid prn. She will let me know if her symptoms persist.   3. Acquired hypothyroidism  I will check thyroid labs and adjust meds as needed.  - TSH  4. Palpitations  I will check her electrolytes. She is also advised to take magnesium tablets 250-436m nightly. She is also advised to incorporated coconut water into her diet. She will let me know if her sx persist.   - BMP8+EGFR        RMaximino Greenland MD

## 2018-09-23 ENCOUNTER — Ambulatory Visit: Payer: 59 | Admitting: Internal Medicine

## 2018-09-23 ENCOUNTER — Encounter: Payer: Self-pay | Admitting: Internal Medicine

## 2018-09-23 VITALS — BP 122/84 | HR 78 | Temp 98.1°F | Ht 65.0 in | Wt 164.4 lb

## 2018-09-23 DIAGNOSIS — E039 Hypothyroidism, unspecified: Secondary | ICD-10-CM | POA: Diagnosis not present

## 2018-09-23 DIAGNOSIS — N926 Irregular menstruation, unspecified: Secondary | ICD-10-CM | POA: Diagnosis not present

## 2018-09-27 ENCOUNTER — Encounter: Payer: Self-pay | Admitting: Internal Medicine

## 2018-09-27 NOTE — Progress Notes (Signed)
Subjective:     Patient ID: Brandy Ortega , female    DOB: 1976/03/19 , 43 y.o.   MRN: [id number]   Chief Complaint  Patient presents with  . hormones f/u    HPI  She is here today for f/u BHRT. She reports compliance with progesterone; however, has had breakthrough bleeding.  She was recently advised by Gyn she has left ovarian cyst. Her Gyn wants to surgically remove it; however, she is hesitant. She is not sure if she wants to have the surgery.     Past Medical History:  Diagnosis Date  . Chiari I malformation ([place])   . Depression    Postpartum  . DSAP (disseminated superficial actinic porokeratosis)   . Generalized anxiety disorder   . Heart murmur   . Hypothyroid   . IBS (irritable bowel syndrome)   . NSVD (normal spontaneous vaginal delivery)    X3  . Ovarian cyst   . Palpitations   . Vitamin D deficiency      Family History  Problem Relation Age of Onset  . COPD Mother   . Hypertension Mother   . Hypothyroidism Mother   . Cancer Father        adrenal  . Asthma Sister   . Hypertension Sister   . Mental illness Sister   . Stroke Maternal Grandfather   . Heart disease Maternal Grandfather   . Diabetes Paternal Grandmother   . Diabetes Paternal Grandfather   . Heart disease Paternal Grandfather      Current Outpatient Medications:  .  albuterol (PROAIR HFA) 108 (90 Base) MCG/ACT inhaler, Take 2 puffs by mouth every 6 (six) hours as needed for wheezing or shortness of breath. , Disp: , Rfl:  .  Ascorbic Acid (VITAMIN C) 1000 MG tablet, Take 1,000 mg by mouth daily., Disp: , Rfl:  .  Cholecalciferol (VITAMIN D3) 2000 units TABS, Take by mouth., Disp: , Rfl:  .  ibuprofen (ADVIL,MOTRIN) 200 MG tablet, Take 800 mg by mouth every 6 (six) hours as needed for headache or mild pain., Disp: , Rfl:  .  levothyroxine (SYNTHROID, LEVOTHROID) 75 MCG tablet, Take 75 mcg by mouth See admin instructions. Monday thru thursday, Disp: , Rfl:  .  levothyroxine  (SYNTHROID, LEVOTHROID) 88 MCG tablet, Take 88 mcg by mouth See admin instructions. Friday thru sunday, Disp: , Rfl:  .  magnesium gluconate (MAGONATE) 500 MG tablet, Take 500 mg by mouth at bedtime., Disp: , Rfl:  .  Progesterone Micronized (PROGESTERONE COMPOUNDING KIT TD), Place onto the skin., Disp: , Rfl:  .  Theanine 100 MG CAPS, Take 100 mg by mouth at bedtime., Disp: , Rfl:  .  valACYclovir (VALTREX) 500 MG tablet, Take 500 mg by mouth daily as needed. , Disp: , Rfl:  .  VITAMIN D, CHOLECALCIFEROL, PO, Take 7,000 Units by mouth every other day., Disp: , Rfl:    Allergies  Allergen Reactions  . No Known Allergies      Review of Systems  Constitutional: Negative.   Respiratory: Negative.   Cardiovascular: Negative.   Gastrointestinal: Negative.   Neurological: Negative.   Psychiatric/Behavioral: Negative.      Today's Vitals   09/23/18 1439  BP: 122/84  Pulse: 78  Temp: 98.1 F (36.7 C)  TempSrc: Oral  Weight: 164 lb 6.4 oz (74.6 kg)  Height: 5' 5"  (1.651 m)   Body mass index is 27.36 kg/m.   Objective:  Physical Exam Vitals signs and nursing note reviewed.  Constitutional:      Appearance: Normal appearance.  HENT:     Head: Normocephalic and atraumatic.  Cardiovascular:     Rate and Rhythm: Normal rate and regular rhythm.     Heart sounds: Normal heart sounds.  Pulmonary:     Effort: Pulmonary effort is normal.     Breath sounds: Normal breath sounds.  Skin:    General: Skin is warm.  Neurological:     General: No focal deficit present.     Mental Status: She is alert.  Psychiatric:        Mood and Affect: Mood normal.         Assessment And Plan:     1. Irregular menstruation  Chronic. I will increase her progesterone capsules accordingly. She is encouraged to move forward with surgical excision of ovarian cyst. She will rto in 4 months for re-evaluation.   2. Acquired hypothyroidism  I will check labs at her next visit. She will continue  with current meds for now.   [name], MD

## 2018-10-14 DIAGNOSIS — N838 Other noninflammatory disorders of ovary, fallopian tube and broad ligament: Secondary | ICD-10-CM | POA: Diagnosis not present

## 2018-10-21 ENCOUNTER — Telehealth: Payer: Self-pay

## 2018-10-21 NOTE — Telephone Encounter (Signed)
Left the pt a message to call back.  I was calling the pt to let her know that Dr. Baird Cancer wants to know if the pt is willing to switch to acyclovir because the pt's insurance no longer covers valtrex.

## 2018-11-03 ENCOUNTER — Encounter: Payer: Self-pay | Admitting: Internal Medicine

## 2018-11-04 ENCOUNTER — Ambulatory Visit: Payer: 59 | Admitting: Internal Medicine

## 2018-11-06 ENCOUNTER — Ambulatory Visit: Payer: 59 | Admitting: Internal Medicine

## 2018-11-28 ENCOUNTER — Other Ambulatory Visit: Payer: Self-pay | Admitting: Internal Medicine

## 2018-12-17 ENCOUNTER — Other Ambulatory Visit: Payer: Self-pay | Admitting: Internal Medicine

## 2018-12-29 ENCOUNTER — Encounter: Payer: Self-pay | Admitting: Internal Medicine

## 2019-01-14 ENCOUNTER — Telehealth: Payer: Self-pay | Admitting: Internal Medicine

## 2019-01-14 NOTE — Telephone Encounter (Signed)
Patient has requested and confirmed her virtual visit with Dr.Sanders on 01/15/2019

## 2019-01-15 ENCOUNTER — Other Ambulatory Visit: Payer: Self-pay

## 2019-01-15 ENCOUNTER — Encounter: Payer: Self-pay | Admitting: Internal Medicine

## 2019-01-15 ENCOUNTER — Ambulatory Visit (INDEPENDENT_AMBULATORY_CARE_PROVIDER_SITE_OTHER): Payer: 59 | Admitting: Internal Medicine

## 2019-01-15 VITALS — Temp 97.5°F | Ht 65.0 in | Wt 157.4 lb

## 2019-01-15 DIAGNOSIS — L565 Disseminated superficial actinic porokeratosis (DSAP): Secondary | ICD-10-CM

## 2019-01-15 DIAGNOSIS — E039 Hypothyroidism, unspecified: Secondary | ICD-10-CM | POA: Diagnosis not present

## 2019-01-15 DIAGNOSIS — N921 Excessive and frequent menstruation with irregular cycle: Secondary | ICD-10-CM

## 2019-01-15 MED ORDER — VALACYCLOVIR HCL 500 MG PO TABS: 500.0000 mg | ORAL_TABLET | Freq: Two times a day (BID) | ORAL | 1 refills | Status: DC | PRN

## 2019-01-18 NOTE — Progress Notes (Signed)
Virtual Visit via Video   This visit type was conducted due to national recommendations for restrictions regarding the COVID-19 Pandemic (e.g. social distancing) in an effort to limit this patient's exposure and mitigate transmission in our community.  Due to her co-morbid illnesses, this patient is at least at moderate risk for complications without adequate follow up.  This format is felt to be most appropriate for this patient at this time.  All issues noted in this document were discussed and addressed.  A limited physical exam was performed with this format.    This visit type was conducted due to national recommendations for restrictions regarding the COVID-19 Pandemic (e.g. social distancing) in an effort to limit this patient's exposure and mitigate transmission in our community.  Patients identity confirmed using two different identifiers.  This format is felt to be most appropriate for this patient at this time.  All issues noted in this document were discussed and addressed.  No physical exam was performed (except for noted visual exam findings with Video Visits).    Date:  01/18/2019   ID:  Brandy Ortega, DOB [birth year], MRN [id number]  Patient Location:  Home  Provider location:   Office    Chief Complaint:  BHRT f/u  History of Present Illness:    Brandy Ortega is a 43 y.o. female who presents via video conferencing for a telehealth visit today.    The patient does not have symptoms concerning for COVID-19 infection (fever, chills, cough, or new shortness of breath).   She presents today for virtual visit. She prefers this method of contact due to COVID-19 pandemic.  She presents today for f/u BHRT. At her last visit, her dose of progesterone was changed to 53m days 1-13 and 1035mdays 14-28. She reports this has improved her cycles greatly - they are less heavy, and do not last as long.     Past Medical History:  Diagnosis Date  . [place]  [place]  . Depression    Postpartum  . DSAP (disseminated superficial actinic porokeratosis)   . Generalized anxiety disorder   . Heart murmur   . Hypothyroid   . IBS (irritable bowel syndrome)   . NSVD (normal spontaneous vaginal delivery)    X3  . Ovarian cyst   . Palpitations   . Vitamin D deficiency    Past Surgical History:  Procedure Laterality Date  . BACK SURGERY    . OVARIAN CYST REMOVAL    . WISDOM TOOTH EXTRACTION       Current Meds  Medication Sig  . albuterol (PROAIR HFA) 108 (90 Base) MCG/ACT inhaler Take 2 puffs by mouth every 6 (six) hours as needed for wheezing or shortness of breath.   . Ascorbic Acid (VITAMIN C) 1000 MG tablet Take 1,000 mg by mouth daily.  . Cholecalciferol (VITAMIN D3) 2000 units TABS Take 3 capsules by mouth.   . Marland Kitchenbuprofen (ADVIL,MOTRIN) 200 MG tablet Take 800 mg by mouth every 6 (six) hours as needed for headache or mild pain.  . Marland Kitchenevothyroxine (SYNTHROID) 75 MCG tablet TAKE 1 TABLET BY MOUTH EVERY DAY FROM MONDAY TO THURSDAY  . levothyroxine (SYNTHROID, LEVOTHROID) 88 MCG tablet Take 88 mcg by mouth See admin instructions. Friday thru sunday  . magnesium gluconate (MAGONATE) 500 MG tablet Take 500 mg by mouth at bedtime.  . Progesterone Micronized (PROGESTERONE COMPOUNDING KIT TD) Take by mouth.   . Theanine 100 MG CAPS Take 100 mg by mouth at bedtime.  .Marland Kitchen  valACYclovir (VALTREX) 500 MG tablet Take 1 tablet (500 mg total) by mouth 2 (two) times daily as needed.  . [DISCONTINUED] valACYclovir (VALTREX) 500 MG tablet TAKE 1 TABLET BY MOUTH EVERY DAY     Allergies:   No known allergies   Social History   Tobacco Use  . Smoking status: Former Research scientist (life sciences)  . Smokeless tobacco: Never Used  . Tobacco comment: quit 30 years ago and smoked 3 years  Substance Use Topics  . Alcohol use: Yes    Comment: occasional wine  . Drug use: No     Family Hx: The patient's family history includes Asthma in her sister; COPD in her mother; Cancer in her  father; Diabetes in her paternal grandfather and paternal grandmother; Heart disease in her maternal grandfather and paternal grandfather; Hypertension in her mother and sister; Hypothyroidism in her mother; Mental illness in her sister; Stroke in her maternal grandfather.  ROS:   Please see the history of present illness.    Review of Systems  Constitutional: Negative.   Respiratory: Negative.   Cardiovascular: Negative.   Gastrointestinal: Negative.   Neurological: Negative.   Psychiatric/Behavioral: Negative.     All other systems reviewed and are negative.   Labs/Other Tests and Data Reviewed:    Recent Labs: 05/20/2018: ALT 30 08/19/2018: BUN 9; Creatinine, Ser 0.59; Potassium 4.5; Sodium 141; TSH 2.700   Recent Lipid Panel No results found for: CHOL, TRIG, HDL, CHOLHDL, LDLCALC, LDLDIRECT  Wt Readings from Last 3 Encounters:  01/15/19 157 lb 6.4 oz (71.4 kg)  09/23/18 164 lb 6.4 oz (74.6 kg)  08/19/18 162 lb 6.4 oz (73.7 kg)     Exam:    Vital Signs:  Temp (!) 97.5 F (36.4 C) (Oral) Comment: pt provided  Ht _0  (1.651 m)   Wt 157 lb 6.4 oz (71.4 kg) Comment: pt provided  LMP 12/31/2018   BMI 26.19 kg/m     Physical Exam  Constitutional: She is oriented to person, place, and time and well-developed, well-nourished, and in no distress.  HENT:  Head: Normocephalic and atraumatic.  Pulmonary/Chest: Effort normal.  Neurological: She is alert and oriented to person, place, and time.  Psychiatric: Affect normal.  Nursing note and vitals reviewed.   ASSESSMENT & PLAN:     1. Menorrhagia with irregular cycle  Chronic, improved with use of BHRT therapy. She will continue with use of compounded progesterone capsules. Refills will be called into  [facility]. She will rto in 4 months for re-evaluation.   2. Acquired hypothyroidism  Chronic. She will continue with current meds for now. Most recent labs reviewed in full detail. She will rto in 4 months for  re-evaluation.   3. DSAP (disseminated superficial actinic porokeratosis)  Chronic. She is also followed by Derm for this.    COVID-19 Education: The signs and symptoms of COVID-19 were discussed with the patient and how to seek care for testing (follow up with PCP or arrange E-visit).  The importance of social distancing was discussed today.  Patient Risk:   After full review of this patients clinical status, I feel that they are at least moderate risk at this time.  Time:   Today, I have spent 15 minutes  with the patient with telehealth technology discussing above diagnoses.     Medication Adjustments/Labs and Tests Ordered: Current medicines are reviewed at length with the patient today.  Concerns regarding medicines are outlined above.   Tests Ordered: No orders of the defined  types were placed in this encounter.   Medication Changes: Meds ordered this encounter  Medications  . valACYclovir (VALTREX) 500 MG tablet    Sig: Take 1 tablet (500 mg total) by mouth 2 (two) times daily as needed.    Dispense:  180 tablet    Refill:  1    Disposition:  Follow up in 4 month(s)  Signed, [name], MD

## 2019-03-24 ENCOUNTER — Encounter: Payer: 59 | Admitting: Internal Medicine

## 2019-03-31 ENCOUNTER — Ambulatory Visit: Payer: 59 | Admitting: Internal Medicine

## 2019-03-31 ENCOUNTER — Encounter: Payer: Self-pay | Admitting: Internal Medicine

## 2019-03-31 ENCOUNTER — Other Ambulatory Visit: Payer: Self-pay

## 2019-03-31 VITALS — BP 108/64 | HR 68 | Temp 98.3°F | Ht 65.0 in | Wt 160.8 lb

## 2019-03-31 DIAGNOSIS — Z0000 Encounter for general adult medical examination without abnormal findings: Secondary | ICD-10-CM

## 2019-03-31 LAB — POCT URINALYSIS DIPSTICK
Bilirubin, UA: NEGATIVE
Glucose, UA: NEGATIVE
Ketones, UA: NEGATIVE
Nitrite, UA: NEGATIVE
Protein, UA: NEGATIVE
Spec Grav, UA: 1.015 (ref 1.010–1.025)
Urobilinogen, UA: 0.2 E.U./dL
pH, UA: 6.5 (ref 5.0–8.0)

## 2019-03-31 NOTE — Patient Instructions (Signed)
Health Maintenance, Female Adopting a healthy lifestyle and getting preventive care are important in promoting health and wellness. Ask your health care provider about:  The right schedule for you to have regular tests and exams.  Things you can do on your own to prevent diseases and keep yourself healthy. What should I know about diet, weight, and exercise? Eat a healthy diet   Eat a diet that includes plenty of vegetables, fruits, low-fat dairy products, and lean protein.  Do not eat a lot of foods that are high in solid fats, added sugars, or sodium. Maintain a healthy weight Body mass index (BMI) is used to identify weight problems. It estimates body fat based on height and weight. Your health care provider can help determine your BMI and help you achieve or maintain a healthy weight. Get regular exercise Get regular exercise. This is one of the most important things you can do for your health. Most adults should:  Exercise for at least 150 minutes each week. The exercise should increase your heart rate and make you sweat (moderate-intensity exercise).  Do strengthening exercises at least twice a week. This is in addition to the moderate-intensity exercise.  Spend less time sitting. Even light physical activity can be beneficial. Watch cholesterol and blood lipids Have your blood tested for lipids and cholesterol at 44 years of age, then have this test every 5 years. Have your cholesterol levels checked more often if:  Your lipid or cholesterol levels are high.  You are older than 44 years of age.  You are at high risk for heart disease. What should I know about cancer screening? Depending on your health history and family history, you may need to have cancer screening at various ages. This may include screening for:  Breast cancer.  Cervical cancer.  Colorectal cancer.  Skin cancer.  Lung cancer. What should I know about heart disease, diabetes, and high blood  pressure? Blood pressure and heart disease  High blood pressure causes heart disease and increases the risk of stroke. This is more likely to develop in people who have high blood pressure readings, are of African descent, or are overweight.  Have your blood pressure checked: ? Every 3-5 years if you are 18-39 years of age. ? Every year if you are 40 years old or older. Diabetes Have regular diabetes screenings. This checks your fasting blood sugar level. Have the screening done:  Once every three years after age 40 if you are at a normal weight and have a low risk for diabetes.  More often and at a younger age if you are overweight or have a high risk for diabetes. What should I know about preventing infection? Hepatitis B If you have a higher risk for hepatitis B, you should be screened for this virus. Talk with your health care provider to find out if you are at risk for hepatitis B infection. Hepatitis C Testing is recommended for:  Everyone born from 1945 through 1965.  Anyone with known risk factors for hepatitis C. Sexually transmitted infections (STIs)  Get screened for STIs, including gonorrhea and chlamydia, if: ? You are sexually active and are younger than 44 years of age. ? You are older than 44 years of age and your health care provider tells you that you are at risk for this type of infection. ? Your sexual activity has changed since you were last screened, and you are at increased risk for chlamydia or gonorrhea. Ask your health care provider if   you are at risk.  Ask your health care provider about whether you are at high risk for HIV. Your health care provider may recommend a prescription medicine to help prevent HIV infection. If you choose to take medicine to prevent HIV, you should first get tested for HIV. You should then be tested every 3 months for as long as you are taking the medicine. Pregnancy  If you are about to stop having your period (premenopausal) and  you may become pregnant, seek counseling before you get pregnant.  Take 400 to 800 micrograms (mcg) of folic acid every day if you become pregnant.  Ask for birth control (contraception) if you want to prevent pregnancy. Osteoporosis and menopause Osteoporosis is a disease in which the bones lose minerals and strength with aging. This can result in bone fractures. If you are 65 years old or older, or if you are at risk for osteoporosis and fractures, ask your health care provider if you should:  Be screened for bone loss.  Take a calcium or vitamin D supplement to lower your risk of fractures.  Be given hormone replacement therapy (HRT) to treat symptoms of menopause. Follow these instructions at home: Lifestyle  Do not use any products that contain nicotine or tobacco, such as cigarettes, e-cigarettes, and chewing tobacco. If you need help quitting, ask your health care provider.  Do not use street drugs.  Do not share needles.  Ask your health care provider for help if you need support or information about quitting drugs. Alcohol use  Do not drink alcohol if: ? Your health care provider tells you not to drink. ? You are pregnant, may be pregnant, or are planning to become pregnant.  If you drink alcohol: ? Limit how much you use to 0-1 drink a day. ? Limit intake if you are breastfeeding.  Be aware of how much alcohol is in your drink. In the [place] one drink equals one 12 oz bottle of beer (355 mL), one 5 oz glass of wine (148 mL), or one 1 oz glass of hard liquor (44 mL). General instructions  Schedule regular health, dental, and eye exams.  Stay current with your vaccines.  Tell your health care provider if: ? You often feel depressed. ? You have ever been abused or do not feel safe at home. Summary  Adopting a healthy lifestyle and getting preventive care are important in promoting health and wellness.  Follow your health care provider's instructions about healthy  diet, exercising, and getting tested or screened for diseases.  Follow your health care provider's instructions on monitoring your cholesterol and blood pressure. This information is not intended to replace advice given to you by your health care provider. Make sure you discuss any questions you have with your health care provider. Document Released: 02/12/2011 Document Revised: 07/23/2018 Document Reviewed: 07/23/2018 Elsevier Patient Education  2020 Elsevier Inc.  

## 2019-03-31 NOTE — Progress Notes (Signed)
Subjective:     Patient ID: Brandy Ortega , female    DOB: February 11, 1976 , 43 y.o.   MRN: [id number]   Chief Complaint  Patient presents with  . Annual Exam    HPI  She is here today for a full physical examination. She is followed by Dr. Jimmye Norman for her GYN exam. Her LMP was 03/25/2019.     Past Medical History:  Diagnosis Date  . Chiari I malformation ([place])   . Depression    Postpartum  . DSAP (disseminated superficial actinic porokeratosis)   . Generalized anxiety disorder   . Heart murmur   . Hypothyroid   . IBS (irritable bowel syndrome)   . NSVD (normal spontaneous vaginal delivery)    X3  . Ovarian cyst   . Palpitations   . Vitamin D deficiency      Family History  Problem Relation Age of Onset  . COPD Mother   . Hypertension Mother   . Hypothyroidism Mother   . Cancer Father        adrenal  . Asthma Sister   . Hypertension Sister   . Mental illness Sister   . Stroke Maternal Grandfather   . Heart disease Maternal Grandfather   . Diabetes Paternal Grandmother   . Diabetes Paternal Grandfather   . Heart disease Paternal Grandfather      Current Outpatient Medications:  .  albuterol (PROAIR HFA) 108 (90 Base) MCG/ACT inhaler, Take 2 puffs by mouth every 6 (six) hours as needed for wheezing or shortness of breath. , Disp: , Rfl:  .  Ascorbic Acid (VITAMIN C) 1000 MG tablet, Take 1,000 mg by mouth daily., Disp: , Rfl:  .  Cholecalciferol (VITAMIN D3) 2000 units TABS, Take 3 capsules by mouth. , Disp: , Rfl:  .  ibuprofen (ADVIL,MOTRIN) 200 MG tablet, Take 800 mg by mouth every 6 (six) hours as needed for headache or mild pain., Disp: , Rfl:  .  levothyroxine (SYNTHROID) 75 MCG tablet, TAKE 1 TABLET BY MOUTH EVERY DAY FROM MONDAY TO THURSDAY, Disp: 30 tablet, Rfl: 3 .  levothyroxine (SYNTHROID, LEVOTHROID) 88 MCG tablet, Take 88 mcg by mouth See admin instructions. Friday thru sunday, Disp: , Rfl:  .  magnesium gluconate (MAGONATE) 500 MG tablet, Take  500 mg by mouth at bedtime., Disp: , Rfl:  .  Omega-3 Fatty Acids (FISH OIL) 1000 MG CAPS, Take 2 capsules by mouth daily., Disp: , Rfl:  .  Progesterone Micronized (PROGESTERONE COMPOUNDING KIT TD), Take by mouth. , Disp: , Rfl:  .  Theanine 100 MG CAPS, Take 100 mg by mouth at bedtime., Disp: , Rfl:  .  valACYclovir (VALTREX) 500 MG tablet, Take 1 tablet (500 mg total) by mouth 2 (two) times daily as needed., Disp: 180 tablet, Rfl: 1   Allergies  Allergen Reactions  . No Known Allergies      Review of Systems  Constitutional: Negative.   HENT: Negative.   Eyes: Negative.   Respiratory: Negative.   Cardiovascular: Negative.   Endocrine: Negative.   Genitourinary: Negative.   Musculoskeletal: Negative.   Skin: Negative.   Allergic/Immunologic: Negative.   Neurological: Negative.   Hematological: Negative.   Psychiatric/Behavioral: Negative.      Today's Vitals   03/31/19 1557  BP: 108/64  Pulse: 68  Temp: 98.3 F (36.8 C)  TempSrc: Oral  Weight: 160 lb 12.8 oz (72.9 kg)  Height: 5' 5"  (1.651 m)   Body mass index is 26.76 kg/m.  Objective:  Physical Exam Vitals signs and nursing note reviewed.  Constitutional:      Appearance: Normal appearance.  HENT:     Head: Normocephalic and atraumatic.     Right Ear: Tympanic membrane, ear canal and external ear normal.     Left Ear: Tympanic membrane, ear canal and external ear normal.     Nose: Nose normal.     Mouth/Throat:     Mouth: Mucous membranes are moist.     Pharynx: Oropharynx is clear.  Eyes:     Extraocular Movements: Extraocular movements intact.     Conjunctiva/sclera: Conjunctivae normal.     Pupils: Pupils are equal, round, and reactive to light.  Neck:     Musculoskeletal: Normal range of motion and neck supple.  Cardiovascular:     Rate and Rhythm: Normal rate and regular rhythm.     Pulses: Normal pulses.     Heart sounds: Normal heart sounds.  Pulmonary:     Effort: Pulmonary effort is  normal.     Breath sounds: Normal breath sounds.  Abdominal:     General: Abdomen is flat. Bowel sounds are normal.     Palpations: Abdomen is soft.  Genitourinary:    Comments: deferred Musculoskeletal: Normal range of motion.  Skin:    General: Skin is warm and dry.     Comments: Dry skin, with scattered areas of hyperpigmentation. No vesicular lesions noted.   Neurological:     General: No focal deficit present.     Mental Status: She is alert and oriented to person, place, and time.  Psychiatric:        Mood and Affect: Mood normal.        Behavior: Behavior normal.         Assessment And Plan:     1. Health maintenance examination  A full exam was performed.  Importance of monthly self breast exams was discussed with the patient.  PATIENT HAS BEEN ADVISED TO GET 30-45 MINUTES REGULAR EXERCISE NO LESS THAN FOUR TO FIVE DAYS PER WEEK - BOTH WEIGHTBEARING EXERCISES AND AEROBIC ARE RECOMMENDED. SHE WAS ADVISED TO FOLLOW A HEALTHY DIET WITH AT LEAST SIX FRUITS/VEGGIES PER DAY, DECREASE INTAKE OF RED MEAT, AND TO INCREASE FISH INTAKE TO TWO DAYS PER WEEK.  MEATS/FISH SHOULD NOT BE FRIED, BAKED OR BROILED IS PREFERABLE.  I SUGGEST WEARING SPF 50 SUNSCREEN ON EXPOSED PARTS AND ESPECIALLY WHEN IN THE DIRECT SUNLIGHT FOR AN EXTENDED PERIOD OF TIME.  PLEASE AVOID FAST FOOD RESTAURANTS AND INCREASE YOUR WATER INTAKE.' - CMP14+EGFR - CBC - Lipid panel - Hemoglobin A1c - TSH - T4, Free        [name], MD    THE PATIENT IS ENCOURAGED TO PRACTICE SOCIAL DISTANCING DUE TO THE COVID-19 PANDEMIC.

## 2019-04-01 LAB — CBC
Hematocrit: 37.3 % (ref 34.0–46.6)
Hemoglobin: 12.7 g/dL (ref 11.1–15.9)
MCH: 31 pg (ref 26.6–33.0)
MCHC: 34 g/dL (ref 31.5–35.7)
MCV: 91 fL (ref 79–97)
Platelets: 246 10*3/uL (ref 150–450)
RBC: 4.1 x10E6/uL (ref 3.77–5.28)
RDW: 11.7 % (ref 11.7–15.4)
WBC: 6.2 10*3/uL (ref 3.4–10.8)

## 2019-04-01 LAB — LIPID PANEL
Chol/HDL Ratio: 3 ratio (ref 0.0–4.4)
Cholesterol, Total: 196 mg/dL (ref 100–199)
HDL: 65 mg/dL (ref >39)
LDL Calculated: 108 mg/dL — ABNORMAL HIGH (ref 0–99)
Triglycerides: 113 mg/dL (ref 0–149)
VLDL Cholesterol Cal: 23 mg/dL (ref 5–40)

## 2019-04-01 LAB — CMP14+EGFR
ALT: 15 IU/L (ref 0–32)
AST: 19 IU/L (ref 0–40)
Albumin/Globulin Ratio: 2 (ref 1.2–2.2)
Albumin: 4.4 g/dL (ref 3.8–4.8)
Alkaline Phosphatase: 70 IU/L (ref 39–117)
BUN/Creatinine Ratio: 20 (ref 9–23)
BUN: 15 mg/dL (ref 6–24)
Bilirubin Total: 0.2 mg/dL (ref 0.0–1.2)
CO2: 24 mmol/L (ref 20–29)
Calcium: 9.2 mg/dL (ref 8.7–10.2)
Chloride: 102 mmol/L (ref 96–106)
Creatinine, Ser: 0.75 mg/dL (ref 0.57–1.00)
GFR calc Af Amer: 112 mL/min/{1.73_m2} (ref >59)
GFR calc non Af Amer: 97 mL/min/{1.73_m2} (ref >59)
Globulin, Total: 2.2 g/dL (ref 1.5–4.5)
Glucose: 88 mg/dL (ref 65–99)
Potassium: 4.1 mmol/L (ref 3.5–5.2)
Sodium: 138 mmol/L (ref 134–144)
Total Protein: 6.6 g/dL (ref 6.0–8.5)

## 2019-04-01 LAB — VITAMIN D 25 HYDROXY (VIT D DEFICIENCY, FRACTURES): Vit D, 25-Hydroxy: 67.7 ng/mL (ref 30.0–100.0)

## 2019-04-01 LAB — T4, FREE: Free T4: 1.03 ng/dL (ref 0.82–1.77)

## 2019-04-01 LAB — SAR COV2 SEROLOGY (COVID19)AB(IGG),IA: SARS-CoV-2 Ab, IgG: NEGATIVE

## 2019-04-01 LAB — HEMOGLOBIN A1C
Est. average glucose Bld gHb Est-mCnc: 97 mg/dL
Hgb A1c MFr Bld: 5 % (ref 4.8–5.6)

## 2019-04-01 LAB — TSH: TSH: 4.26 u[IU]/mL (ref 0.450–4.500)

## 2019-04-10 ENCOUNTER — Encounter: Payer: Self-pay | Admitting: Internal Medicine

## 2019-04-13 ENCOUNTER — Other Ambulatory Visit: Payer: Self-pay

## 2019-04-19 IMAGING — MG DIGITAL SCREENING BILATERAL MAMMOGRAM WITH TOMO AND CAD
5 series · 6 of 17 positions shown · non-contrast
Comparison: Previous exam(s).

CLINICAL DATA: Screening.

EXAM:
DIGITAL SCREENING BILATERAL MAMMOGRAM WITH TOMO AND CAD

[R MLO synth-2D]
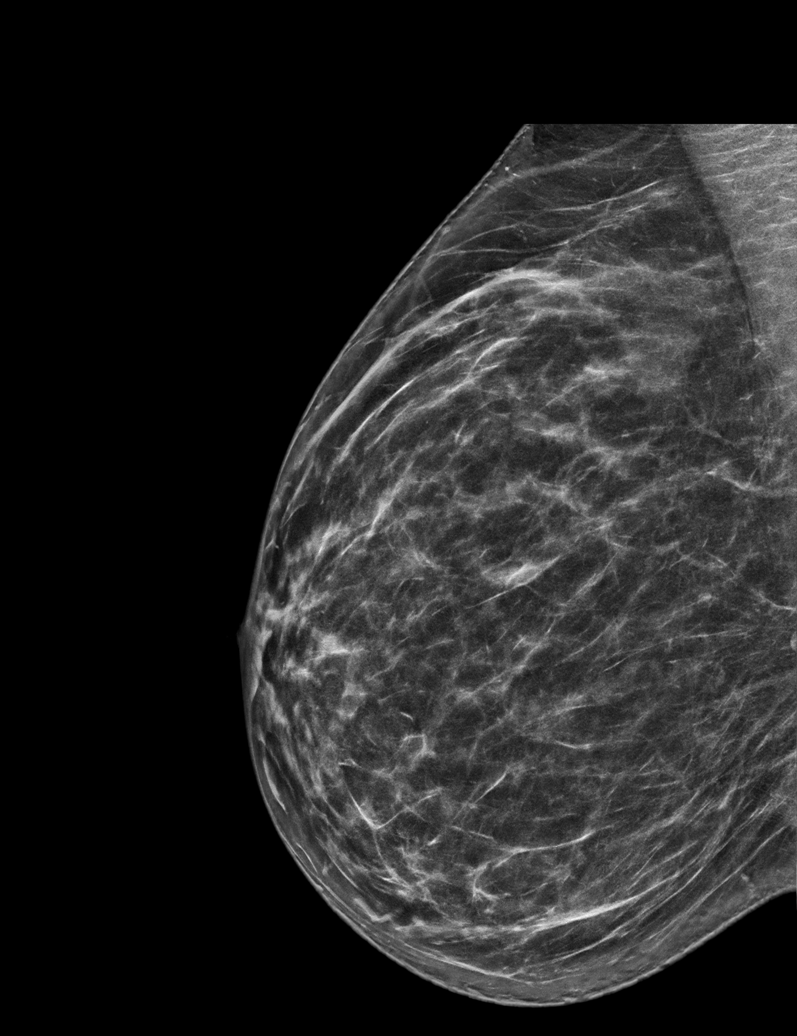

[L MLO synth-2D]
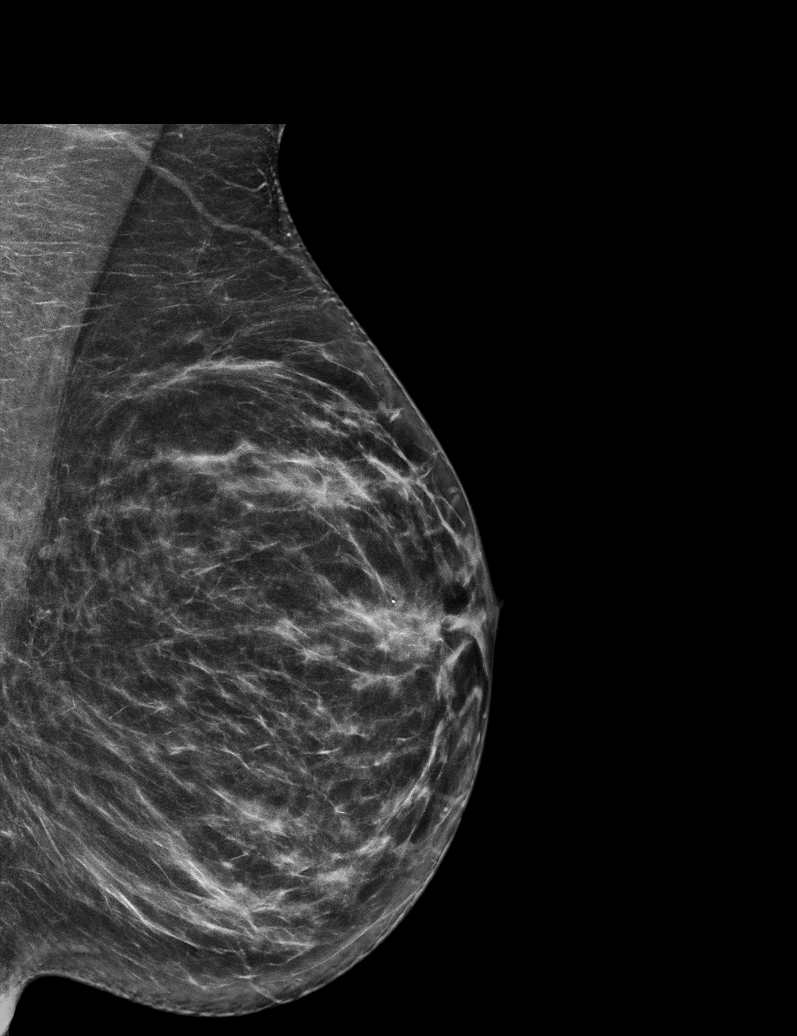

[R MLO tomo · 2 of 66 frames shown]
[frame 22/66]
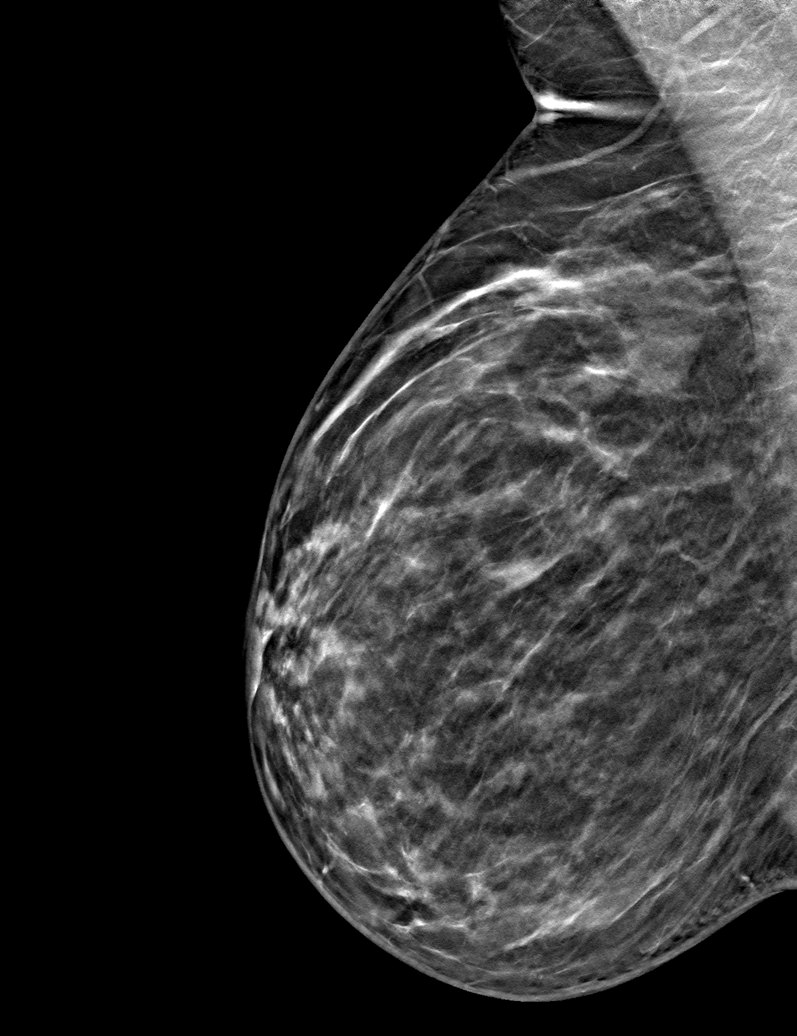
[frame 33/66]
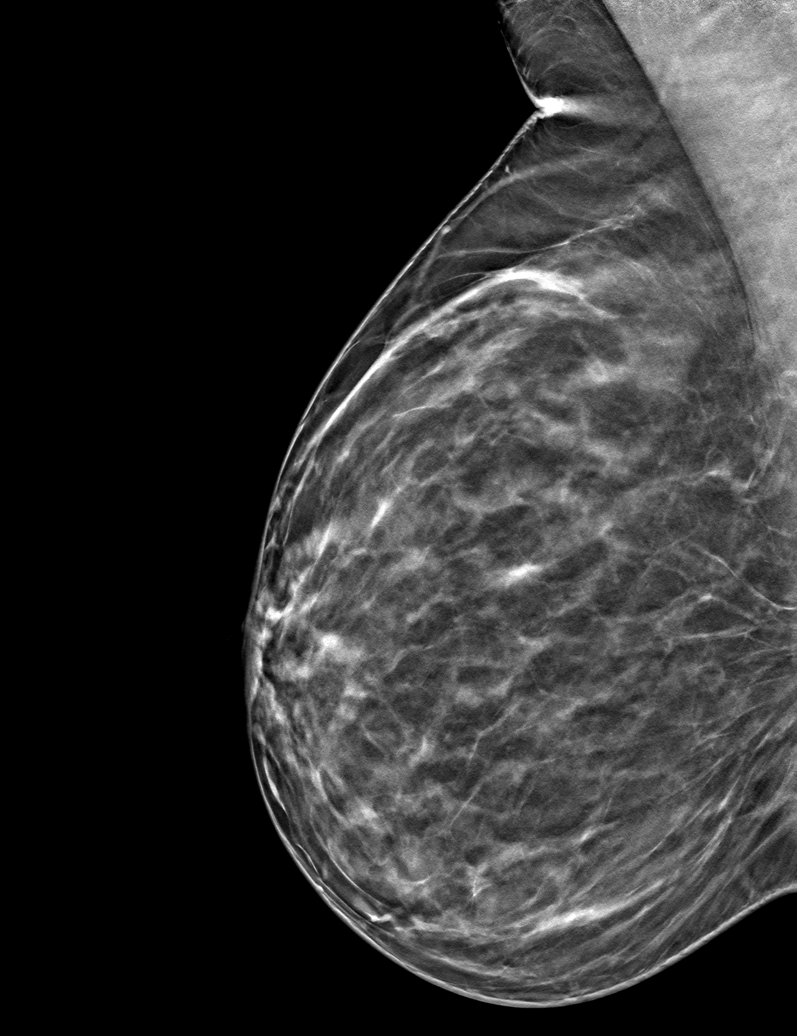

[L MLO tomo · tomo slice 35/69.0]
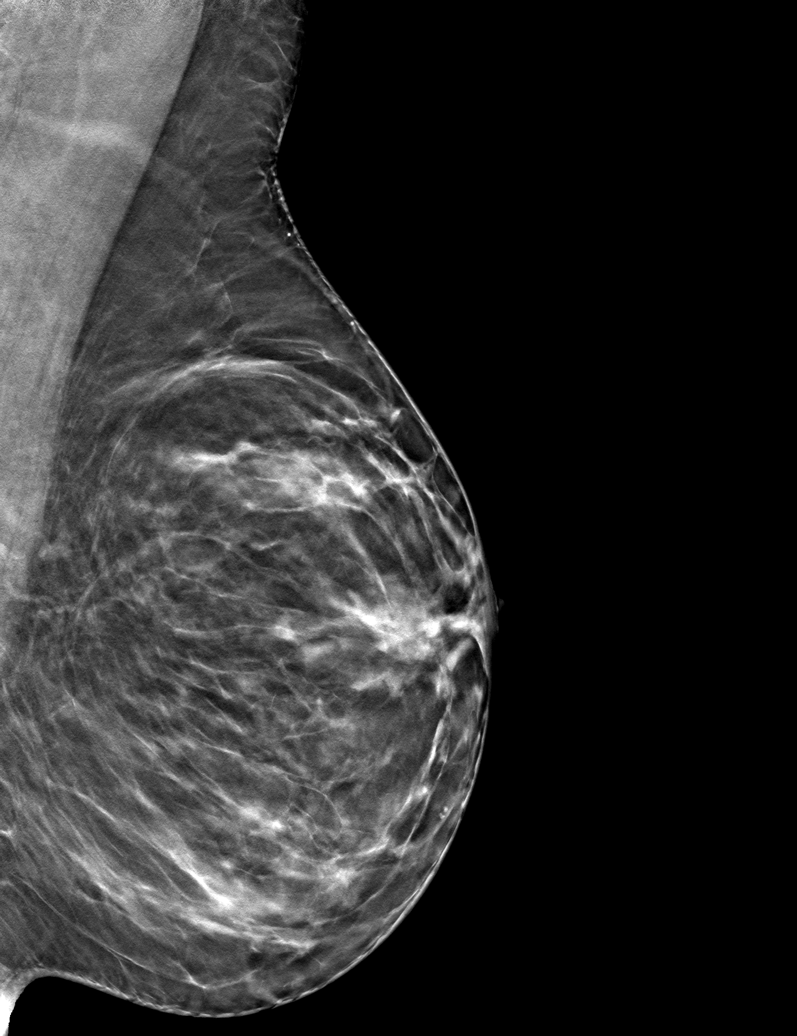

[R CC tomo · tomo slice 31/61.0]
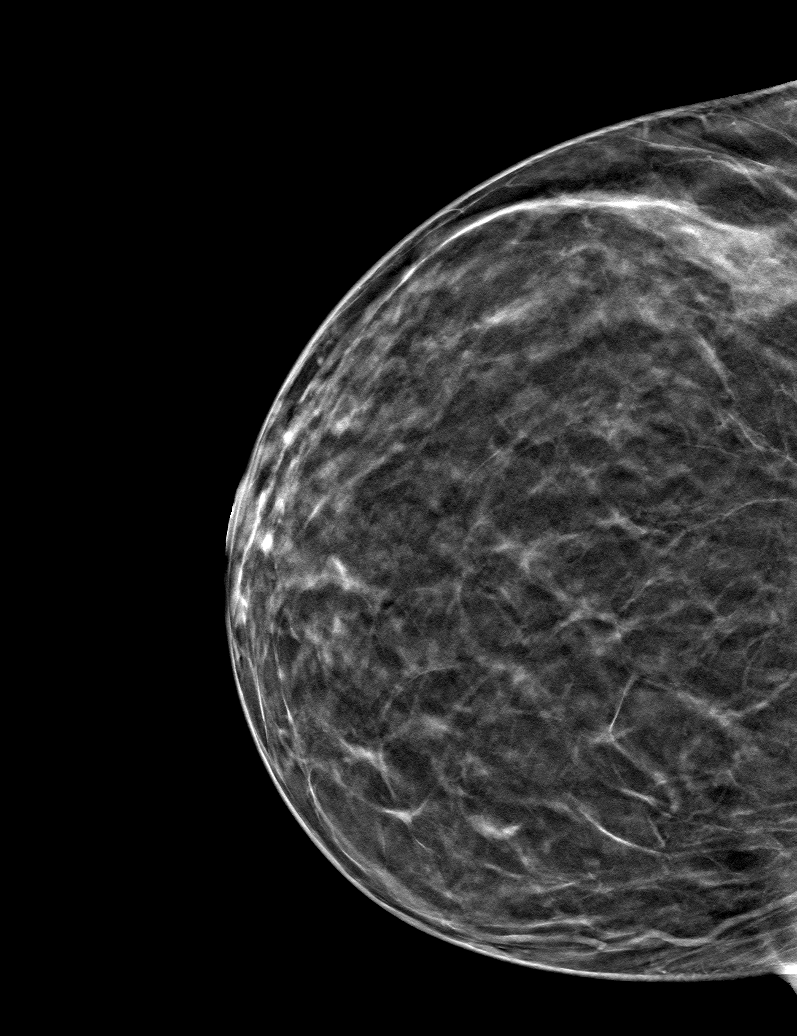

[6 of 17 positions shown; findings below may reference images not displayed]

ACR Breast Density Category b: There are scattered areas of
fibroglandular density.
FINDINGS: There are no findings suspicious for malignancy. Images were
processed with CAD.
IMPRESSION: No mammographic evidence of malignancy. A result letter of this
screening mammogram will be mailed directly to the patient.

RECOMMENDATION:
Screening mammogram in one year. (Code:CN-U-775)

BI-RADS CATEGORY  1: Negative.

## 2019-05-18 ENCOUNTER — Encounter: Payer: Self-pay | Admitting: Internal Medicine

## 2019-08-19 ENCOUNTER — Encounter: Payer: 59 | Admitting: Neurology

## 2019-09-16 ENCOUNTER — Encounter: Payer: Self-pay | Admitting: Internal Medicine

## 2019-09-24 ENCOUNTER — Other Ambulatory Visit: Payer: Self-pay | Admitting: Internal Medicine

## 2019-09-30 ENCOUNTER — Other Ambulatory Visit: Payer: Self-pay

## 2019-09-30 ENCOUNTER — Ambulatory Visit (INDEPENDENT_AMBULATORY_CARE_PROVIDER_SITE_OTHER): Payer: 59 | Admitting: Neurology

## 2019-09-30 ENCOUNTER — Encounter: Payer: 59 | Admitting: Neurology

## 2019-09-30 DIAGNOSIS — G8929 Other chronic pain: Secondary | ICD-10-CM | POA: Insufficient documentation

## 2019-09-30 DIAGNOSIS — Z0289 Encounter for other administrative examinations: Secondary | ICD-10-CM

## 2019-09-30 DIAGNOSIS — M5442 Lumbago with sciatica, left side: Secondary | ICD-10-CM | POA: Diagnosis not present

## 2019-09-30 NOTE — Procedures (Addendum)
Full Name: Brandy Ortega Gender: Female MRN #: [id number] Date of Birth: [birth year]    Visit Date: 09/30/2019 08:59 Age: 44 Years Examining Physician: Marcial Pacas, MD  Referring Physician: Susa Day, MD History: 44 year old female, complains of chronic low back pain, radiating pain to left lateral thigh  On examination: Bilateral lower extremity motor and sensory examination were normal.  Deep tendon reflexes were normal and symmetric  Summary of the tests:  Nerve conduction study: Bilateral sural, superficial sensory responses were normal.  Bilateral peroneal to EDB and tibial motor responses were normal.  Bilateral tibial H reflexes were normal and symmetric.  Conclusion: This is a normal study, there is no electrodiagnostic evidence of large fiber neuropathy, or bilateral lumbosacral radiculopathy.    ------------------------------- Marcial Pacas, M.D. PhD  [facility] [place], [place] [address] Tel: [phone] Fax: [phone]         [facility]    Nerve / Sites Muscle Latency Ref. Amplitude Ref. Rel Amp Segments Distance Velocity Ref. Area    ms ms mV mV %  cm m/s m/s mVms  L Peroneal - EDB     Ankle EDB 5.6 ?6.5 7.4 ?2.0 100 Ankle - EDB 9   30.0     Fib head EDB 12.1  6.5  87.1 Fib head - Ankle 31 47 ?44 28.6     Pop fossa EDB 14.2  5.3  81.6 Pop fossa - Fib head 10 48 ?44 23.8         Pop fossa - Ankle      R Peroneal - EDB     Ankle EDB 5.2 ?6.5 8.5 ?2.0 100 Ankle - EDB 9   30.2     Fib head EDB 11.5  7.6  89.8 Fib head - Ankle 30 48 ?44 27.8     Pop fossa EDB 13.5  7.4  96.8 Pop fossa - Fib head 10 48 ?44 27.3         Pop fossa - Ankle      L Tibial - AH     Ankle AH 4.1 ?5.8 10.7 ?4.0 100 Ankle - AH 9   34.8     Pop fossa AH 12.8  9.6  89.5 Pop fossa - Ankle 36 41 ?41 35.2  R Tibial - AH     Ankle AH 4.7 ?5.8 10.5 ?4.0 100 Ankle - AH 9   27.2     Pop fossa AH 13.5  8.5  80.7 Pop fossa - Ankle 36 41 ?41 25.5       SNC    Nerve / Sites Rec. Site Peak Lat Ref.  Amp Ref. Segments Distance    ms ms V V  cm  L Sural - Ankle (Calf)     Calf Ankle 4.4 ?4.4 19 ?6 Calf - Ankle 14  R Sural - Ankle (Calf)     Calf Ankle 3.8 ?4.4 24 ?6 Calf - Ankle 14  L Superficial peroneal - Ankle     Lat leg Ankle 4.1 ?4.4 18 ?6 Lat leg - Ankle 14  R Superficial peroneal - Ankle     Lat leg Ankle 4.0 ?4.4 7 ?6 Lat leg - Ankle 14             F  Wave    Nerve F Lat Ref.   ms ms  L Tibial - AH 50.8 ?56.0  R Tibial - AH 53.5 ?56.0  H Reflex    Nerve H Lat Lat Hmax   ms ms   Left Right Ref. Left Right Ref.  Tibial - [place] 41.9 41.4 ?35.0 34.8 36.0 ?35.0          EMG Summary Table    Spontaneous MUAP Recruitment  Muscle IA Fib PSW Fasc Other Amp Dur. Poly Pattern  L. Tibialis anterior Normal None None None _______ Normal Normal Normal Normal  L. Peroneus longus Normal None None None _______ Normal Normal Normal Normal  L. Gastrocnemius (Medial head) Normal None None None _______ Normal Normal Normal Normal  L. Tibialis posterior Normal None None None _______ Normal Normal Normal Normal  L. Vastus lateralis Normal None None None _______ Normal Normal Normal Normal  R. Tibialis anterior Normal None None None _______ Normal Normal Normal Normal  R. Tibialis posterior Normal None None None _______ Normal Normal Normal Normal  R. Peroneus longus Normal None None None _______ Normal Normal Normal Normal  R. Vastus lateralis Normal None None None _______ Normal Normal Normal Normal  R. Lumbar paraspinals (mid) Normal None None None _______ Normal Normal Normal Normal  R. Lumbar paraspinals (low) Normal None None None _______ Normal Normal Normal Normal  L. Lumbar paraspinals (mid) Normal None None None _______ Normal Normal Normal Normal  L. Lumbar paraspinals (low) Normal None None None _______ Normal Normal Normal Normal

## 2019-10-05 ENCOUNTER — Encounter: Payer: Self-pay | Admitting: Internal Medicine

## 2019-10-05 ENCOUNTER — Other Ambulatory Visit: Payer: Self-pay

## 2019-10-05 ENCOUNTER — Ambulatory Visit: Payer: 59 | Admitting: Internal Medicine

## 2019-10-05 VITALS — BP 108/66 | HR 68 | Temp 97.9°F | Ht 65.0 in | Wt 171.4 lb

## 2019-10-05 DIAGNOSIS — E039 Hypothyroidism, unspecified: Secondary | ICD-10-CM | POA: Diagnosis not present

## 2019-10-05 DIAGNOSIS — Z8616 Personal history of COVID-19: Secondary | ICD-10-CM | POA: Diagnosis not present

## 2019-10-05 DIAGNOSIS — F419 Anxiety disorder, unspecified: Secondary | ICD-10-CM

## 2019-10-05 DIAGNOSIS — N926 Irregular menstruation, unspecified: Secondary | ICD-10-CM | POA: Diagnosis not present

## 2019-10-05 NOTE — Patient Instructions (Signed)
  [employer] - air purifier

## 2019-10-06 LAB — CMP14+EGFR
ALT: 16 IU/L (ref 0–32)
AST: 18 IU/L (ref 0–40)
Albumin/Globulin Ratio: 1.9 (ref 1.2–2.2)
Albumin: 4.1 g/dL (ref 3.8–4.8)
Alkaline Phosphatase: 75 IU/L (ref 39–117)
BUN/Creatinine Ratio: 15 (ref 9–23)
BUN: 11 mg/dL (ref 6–24)
Bilirubin Total: 0.4 mg/dL (ref 0.0–1.2)
CO2: 24 mmol/L (ref 20–29)
Calcium: 8.8 mg/dL (ref 8.7–10.2)
Chloride: 104 mmol/L (ref 96–106)
Creatinine, Ser: 0.72 mg/dL (ref 0.57–1.00)
GFR calc Af Amer: 118 mL/min/{1.73_m2} (ref >59)
GFR calc non Af Amer: 102 mL/min/{1.73_m2} (ref >59)
Globulin, Total: 2.2 g/dL (ref 1.5–4.5)
Glucose: 77 mg/dL (ref 65–99)
Potassium: 4.3 mmol/L (ref 3.5–5.2)
Sodium: 139 mmol/L (ref 134–144)
Total Protein: 6.3 g/dL (ref 6.0–8.5)

## 2019-10-06 LAB — TSH: TSH: 2.67 u[IU]/mL (ref 0.450–4.500)

## 2019-10-06 LAB — T4, FREE: Free T4: 1.15 ng/dL (ref 0.82–1.77)

## 2019-10-08 NOTE — Progress Notes (Signed)
This visit occurred during the SARS-CoV-2 public health emergency.  Safety protocols were in place, including screening questions prior to the visit, additional usage of staff PPE, and extensive cleaning of exam room while observing appropriate contact time as indicated for disinfecting solutions.  Subjective:     Patient ID: Brandy Ortega , female    DOB: 03/26/1976 , 44 y.o.   MRN: [id number]   Chief Complaint  Patient presents with  . Hormones f/u    HPI  She is here today for BHRT f/u. She reports compliance with progesterone. She has no problems with her hormone regimen at this time.   She does add that she has been experiencing more anxiety. Her sx seem to have worsened since having COVID in January.     Past Medical History:  Diagnosis Date  . Chiari I malformation ([place])   . Depression    Postpartum  . DSAP (disseminated superficial actinic porokeratosis)   . Generalized anxiety disorder   . Heart murmur   . Hypothyroid   . IBS (irritable bowel syndrome)   . NSVD (normal spontaneous vaginal delivery)    X3  . Ovarian cyst   . Palpitations   . Vitamin D deficiency      Family History  Problem Relation Age of Onset  . COPD Mother   . Hypertension Mother   . Hypothyroidism Mother   . Cancer Father        adrenal  . Asthma Sister   . Hypertension Sister   . Mental illness Sister   . Stroke Maternal Grandfather   . Heart disease Maternal Grandfather   . Diabetes Paternal Grandmother   . Diabetes Paternal Grandfather   . Heart disease Paternal Grandfather      Current Outpatient Medications:  .  albuterol (PROAIR HFA) 108 (90 Base) MCG/ACT inhaler, Take 2 puffs by mouth every 6 (six) hours as needed for wheezing or shortness of breath. , Disp: , Rfl:  .  Ascorbic Acid (VITAMIN C) 1000 MG tablet, Take 1,000 mg by mouth daily., Disp: , Rfl:  .  Cholecalciferol (VITAMIN D3) 2000 units TABS, Take 3 capsules by mouth. , Disp: , Rfl:  .  levothyroxine  (SYNTHROID) 75 MCG tablet, TAKE 1 TABLET BY MOUTH EVERY DAY FROM MONDAY TO THURSDAY, Disp: 30 tablet, Rfl: 3 .  levothyroxine (SYNTHROID) 88 MCG tablet, TAKE 1 TABLET BY MOUTH DAILY ON FRIDAY, SATURDAY AND SUNDAY, Disp: 30 tablet, Rfl: 2 .  magnesium gluconate (MAGONATE) 500 MG tablet, Take 500 mg by mouth at bedtime., Disp: , Rfl:  .  Omega-3 Fatty Acids (FISH OIL) 1000 MG CAPS, Take 2 capsules by mouth daily., Disp: , Rfl:  .  Progesterone Micronized (PROGESTERONE COMPOUNDING KIT TD), Take by mouth. , Disp: , Rfl:  .  Theanine 100 MG CAPS, Take 100 mg by mouth at bedtime., Disp: , Rfl:  .  valACYclovir (VALTREX) 500 MG tablet, Take 1 tablet (500 mg total) by mouth 2 (two) times daily as needed., Disp: 180 tablet, Rfl: 1 .  ibuprofen (ADVIL,MOTRIN) 200 MG tablet, Take 800 mg by mouth every 6 (six) hours as needed for headache or mild pain., Disp: , Rfl:    Allergies  Allergen Reactions  . No Known Allergies      Review of Systems  Constitutional: Negative.   Respiratory: Negative.   Cardiovascular: Negative.   Gastrointestinal: Negative.   Neurological: Negative.   Psychiatric/Behavioral: Negative.      Today's Vitals   10/05/19 1421  BP: 108/66  Pulse: 68  Temp: 97.9 F (36.6 C)  TempSrc: Oral  Weight: 171 lb 6.4 oz (77.7 kg)  Height: 5' 5"  (1.651 m)   Body mass index is 28.52 kg/m.   Objective:  Physical Exam Vitals and nursing note reviewed.  Constitutional:      Appearance: Normal appearance.  HENT:     Head: Normocephalic and atraumatic.  Cardiovascular:     Rate and Rhythm: Normal rate and regular rhythm.     Heart sounds: Normal heart sounds.  Pulmonary:     Effort: Pulmonary effort is normal.     Breath sounds: Normal breath sounds.  Skin:    General: Skin is warm.  Neurological:     General: No focal deficit present.     Mental Status: She is alert.  Psychiatric:        Mood and Affect: Mood normal.        Behavior: Behavior normal.          Assessment And Plan:     1. Irregular menstruation  Chronic. She will continue with her current progesterone regimen. Refills will be called into [facility]. I will check labs as listed below. She will rto in six months for re-evaluation.   - CMP14+EGFR  2. Anxiety  Chronic, she will continue with current supplementation with magnesium and L-theanine. I will also check thyroid function to ensure that she is not over-treated.   3. Acquired hypothyroidism  I will check thyroid panel and adjust meds as needed.  - TSH - T4, free  4. Personal history of covid-19   [name], MD    THE PATIENT IS ENCOURAGED TO PRACTICE SOCIAL DISTANCING DUE TO THE COVID-19 PANDEMIC.

## 2019-11-09 ENCOUNTER — Encounter: Payer: 59 | Admitting: Internal Medicine

## 2019-11-09 ENCOUNTER — Other Ambulatory Visit: Payer: Self-pay

## 2019-11-09 ENCOUNTER — Encounter: Payer: Self-pay | Admitting: Internal Medicine

## 2019-11-22 NOTE — Progress Notes (Signed)
Pt not seen for virtual visit. This visit was cancelled.

## 2019-11-24 ENCOUNTER — Encounter: Payer: Self-pay | Admitting: Internal Medicine

## 2019-12-03 ENCOUNTER — Encounter: Payer: Self-pay | Admitting: Internal Medicine

## 2019-12-07 ENCOUNTER — Telehealth: Payer: 59 | Admitting: Internal Medicine

## 2019-12-14 ENCOUNTER — Other Ambulatory Visit: Payer: Self-pay

## 2019-12-14 ENCOUNTER — Telehealth (INDEPENDENT_AMBULATORY_CARE_PROVIDER_SITE_OTHER): Payer: 59 | Admitting: Internal Medicine

## 2019-12-14 ENCOUNTER — Encounter: Payer: Self-pay | Admitting: Internal Medicine

## 2019-12-14 ENCOUNTER — Telehealth: Payer: Self-pay

## 2019-12-14 VITALS — HR 71 | Ht 65.0 in | Wt 164.0 lb

## 2019-12-14 DIAGNOSIS — F419 Anxiety disorder, unspecified: Secondary | ICD-10-CM

## 2019-12-14 DIAGNOSIS — E279 Disorder of adrenal gland, unspecified: Secondary | ICD-10-CM | POA: Diagnosis not present

## 2019-12-14 NOTE — Telephone Encounter (Signed)
PT CONSENT TO VIRTUAL VISIT 05/03. AOB OBTAINED

## 2019-12-20 NOTE — Progress Notes (Signed)
Virtual Visit via Video   This visit type was conducted due to national recommendations for restrictions regarding the COVID-19 Pandemic (e.g. social distancing) in an effort to limit this patient's exposure and mitigate transmission in our community.  Due to her co-morbid illnesses, this patient is at least at moderate risk for complications without adequate follow up.  This format is felt to be most appropriate for this patient at this time.  All issues noted in this document were discussed and addressed.  A limited physical exam was performed with this format.    This visit type was conducted due to national recommendations for restrictions regarding the COVID-19 Pandemic (e.g. social distancing) in an effort to limit this patient's exposure and mitigate transmission in our community.  Patients identity confirmed using two different identifiers.  This format is felt to be most appropriate for this patient at this time.  All issues noted in this document were discussed and addressed.  No physical exam was performed (except for noted visual exam findings with Video Visits).    Date:  12/20/2019   ID:  Brandy Ortega, DOB [birth year], MRN [id number]  Patient Location:    Provider location:   Office    Chief Complaint:  "I have saliva test results"  History of Present Illness:    Brandy Ortega is a 44 y.o. female who presents via video conferencing for a telehealth visit today.    The patient does not have symptoms concerning for COVID-19 infection (fever, chills, cough, or new shortness of breath).   She presents virtually today to go over her saliva test results. She prefers this type of visit due to COVID-19 pandemic. She recently submitted saliva for cortisol testing. She does not feel stressed at this time. She does have h/o anxiety.     Past Medical History:  Diagnosis Date  . Chiari I malformation ([place])   . Depression    Postpartum  . DSAP (disseminated  superficial actinic porokeratosis)   . Generalized anxiety disorder   . Heart murmur   . Hypothyroid   . IBS (irritable bowel syndrome)   . NSVD (normal spontaneous vaginal delivery)    X3  . Ovarian cyst   . Palpitations   . Vitamin D deficiency    Past Surgical History:  Procedure Laterality Date  . CERVICAL SPINE SURGERY  2017  . OVARIAN CYST REMOVAL  2002  . [place]     No outpatient medications have been marked as taking for the 12/14/19 encounter (Video Visit) with Glendale Chard, MD.     Allergies:   No known allergies   Social History   Tobacco Use  . Smoking status: Former Smoker    Packs/day: 0.25    Years: 3.00    Pack years: 0.75    Types: Cigarettes  . Smokeless tobacco: Never Used  . Tobacco comment: quit 30 years ago and smoked 3 years  Substance Use Topics  . Alcohol use: Yes    Comment: occasional wine  . Drug use: No     Family Hx: The patient's family history includes Asthma in her sister; COPD in her mother; Cancer in her father; Diabetes in her paternal grandfather and paternal grandmother; Heart disease in her maternal grandfather and paternal grandfather; Hypertension in her mother and sister; Hypothyroidism in her mother; Mental illness in her sister; Stroke in her maternal grandfather.  ROS:   Please see the history of present illness.    Review of  Systems  Constitutional: Negative.   Respiratory: Negative.   Cardiovascular: Negative.   Gastrointestinal: Negative.   Neurological: Negative.   Psychiatric/Behavioral: Negative.     All other systems reviewed and are negative.   Labs/Other Tests and Data Reviewed:    Recent Labs: 03/31/2019: Hemoglobin 12.7; Platelets 246 10/05/2019: ALT 16; BUN 11; Creatinine, Ser 0.72; Potassium 4.3; Sodium 139; TSH 2.670   Recent Lipid Panel Lab Results  Component Value Date/Time   CHOL 196 03/31/2019 04:42 PM   TRIG 113 03/31/2019 04:42 PM   HDL 65 03/31/2019 04:42 PM    CHOLHDL 3.0 03/31/2019 04:42 PM   LDLCALC 108 (H) 03/31/2019 04:42 PM    Wt Readings from Last 3 Encounters:  12/14/19 164 lb (74.4 kg)  10/05/19 171 lb 6.4 oz (77.7 kg)  03/31/19 160 lb 12.8 oz (72.9 kg)     Exam:    Vital Signs:  Pulse 71 Comment: pt provided  Ht 5' 5"  (1.651 m)   Wt 164 lb (74.4 kg) Comment: pt provided  LMP 11/24/2019   BMI 27.29 kg/m     Physical Exam  Constitutional: She is oriented to person, place, and time and well-developed, well-nourished, and in no distress.  HENT:  Head: Normocephalic and atraumatic.  Pulmonary/Chest: Effort normal.  Musculoskeletal:     Cervical back: Normal range of motion.  Neurological: She is alert and oriented to person, place, and time.  Psychiatric: Affect normal.  Nursing note and vitals reviewed.   ASSESSMENT & PLAN:     1. Adrenal abnormality (HCC)  We went over her saliva test results in full detail during her visit. Her results are significant for stage 1 adrenal fatigue. Lifestyle management is mainstay of adrenal fatigue. If needed, supplements can also be used to address this. I will revisit this at her next visit.   2. Anxiety  Chronic, yet stable. She is encouraged to continue with magnesium and L-theanine as needed.    COVID-19 Education: The signs and symptoms of COVID-19 were discussed with the patient and how to seek care for testing (follow up with PCP or arrange E-visit).  The importance of social distancing was discussed today.  Patient Risk:   After full review of this patients clinical status, I feel that they are at least moderate risk at this time.  I personally spent 25 minutes face-to-face and non-face-to-face in the care of this patient, which includes all pre-, intra-, and post visit time on the date of service.   Medication Adjustments/Labs and Tests Ordered: Current medicines are reviewed at length with the patient today.  Concerns regarding medicines are outlined above.   Tests  Ordered: No orders of the defined types were placed in this encounter.   Medication Changes: No orders of the defined types were placed in this encounter.   Disposition:  Follow up in 3 month(s)  Signed, [name], MD

## 2019-12-21 ENCOUNTER — Encounter: Payer: Self-pay | Admitting: Internal Medicine

## 2019-12-24 ENCOUNTER — Encounter: Payer: Self-pay | Admitting: Internal Medicine

## 2020-02-24 ENCOUNTER — Other Ambulatory Visit: Payer: Self-pay | Admitting: Internal Medicine

## 2020-02-25 ENCOUNTER — Other Ambulatory Visit: Payer: Self-pay

## 2020-04-05 ENCOUNTER — Encounter: Payer: 59 | Admitting: Internal Medicine

## 2020-05-09 ENCOUNTER — Encounter: Payer: Self-pay | Admitting: Internal Medicine

## 2020-05-23 ENCOUNTER — Encounter: Payer: Self-pay | Admitting: Internal Medicine

## 2020-05-23 ENCOUNTER — Ambulatory Visit (INDEPENDENT_AMBULATORY_CARE_PROVIDER_SITE_OTHER): Payer: 59 | Admitting: Internal Medicine

## 2020-05-23 ENCOUNTER — Other Ambulatory Visit: Payer: Self-pay

## 2020-05-23 VITALS — BP 110/76 | HR 70 | Temp 98.0°F | Ht 65.0 in | Wt 166.2 lb

## 2020-05-23 DIAGNOSIS — Z1231 Encounter for screening mammogram for malignant neoplasm of breast: Secondary | ICD-10-CM | POA: Diagnosis not present

## 2020-05-23 DIAGNOSIS — Z0000 Encounter for general adult medical examination without abnormal findings: Secondary | ICD-10-CM | POA: Diagnosis not present

## 2020-05-23 NOTE — Patient Instructions (Signed)
Health Maintenance, Female Adopting a healthy lifestyle and getting preventive care are important in promoting health and wellness. Ask your health care provider about:  The right schedule for you to have regular tests and exams.  Things you can do on your own to prevent diseases and keep yourself healthy. What should I know about diet, weight, and exercise? Eat a healthy diet   Eat a diet that includes plenty of vegetables, fruits, low-fat dairy products, and lean protein.  Do not eat a lot of foods that are high in solid fats, added sugars, or sodium. Maintain a healthy weight Body mass index (BMI) is used to identify weight problems. It estimates body fat based on height and weight. Your health care provider can help determine your BMI and help you achieve or maintain a healthy weight. Get regular exercise Get regular exercise. This is one of the most important things you can do for your health. Most adults should:  Exercise for at least 150 minutes each week. The exercise should increase your heart rate and make you sweat (moderate-intensity exercise).  Do strengthening exercises at least twice a week. This is in addition to the moderate-intensity exercise.  Spend less time sitting. Even light physical activity can be beneficial. Watch cholesterol and blood lipids Have your blood tested for lipids and cholesterol at 45 years of age, then have this test every 5 years. Have your cholesterol levels checked more often if:  Your lipid or cholesterol levels are high.  You are older than 45 years of age.  You are at high risk for heart disease. What should I know about cancer screening? Depending on your health history and family history, you may need to have cancer screening at various ages. This may include screening for:  Breast cancer.  Cervical cancer.  Colorectal cancer.  Skin cancer.  Lung cancer. What should I know about heart disease, diabetes, and high blood  pressure? Blood pressure and heart disease  High blood pressure causes heart disease and increases the risk of stroke. This is more likely to develop in people who have high blood pressure readings, are of African descent, or are overweight.  Have your blood pressure checked: ? Every 3-5 years if you are 18-39 years of age. ? Every year if you are 40 years old or older. Diabetes Have regular diabetes screenings. This checks your fasting blood sugar level. Have the screening done:  Once every three years after age 40 if you are at a normal weight and have a low risk for diabetes.  More often and at a younger age if you are overweight or have a high risk for diabetes. What should I know about preventing infection? Hepatitis B If you have a higher risk for hepatitis B, you should be screened for this virus. Talk with your health care provider to find out if you are at risk for hepatitis B infection. Hepatitis C Testing is recommended for:  Everyone born from 1945 through 1965.  Anyone with known risk factors for hepatitis C. Sexually transmitted infections (STIs)  Get screened for STIs, including gonorrhea and chlamydia, if: ? You are sexually active and are younger than 45 years of age. ? You are older than 45 years of age and your health care provider tells you that you are at risk for this type of infection. ? Your sexual activity has changed since you were last screened, and you are at increased risk for chlamydia or gonorrhea. Ask your health care provider if   you are at risk.  Ask your health care provider about whether you are at high risk for HIV. Your health care provider may recommend a prescription medicine to help prevent HIV infection. If you choose to take medicine to prevent HIV, you should first get tested for HIV. You should then be tested every 3 months for as long as you are taking the medicine. Pregnancy  If you are about to stop having your period (premenopausal) and  you may become pregnant, seek counseling before you get pregnant.  Take 400 to 800 micrograms (mcg) of folic acid every day if you become pregnant.  Ask for birth control (contraception) if you want to prevent pregnancy. Osteoporosis and menopause Osteoporosis is a disease in which the bones lose minerals and strength with aging. This can result in bone fractures. If you are 65 years old or older, or if you are at risk for osteoporosis and fractures, ask your health care provider if you should:  Be screened for bone loss.  Take a calcium or vitamin D supplement to lower your risk of fractures.  Be given hormone replacement therapy (HRT) to treat symptoms of menopause. Follow these instructions at home: Lifestyle  Do not use any products that contain nicotine or tobacco, such as cigarettes, e-cigarettes, and chewing tobacco. If you need help quitting, ask your health care provider.  Do not use street drugs.  Do not share needles.  Ask your health care provider for help if you need support or information about quitting drugs. Alcohol use  Do not drink alcohol if: ? Your health care provider tells you not to drink. ? You are pregnant, may be pregnant, or are planning to become pregnant.  If you drink alcohol: ? Limit how much you use to 0-1 drink a day. ? Limit intake if you are breastfeeding.  Be aware of how much alcohol is in your drink. In the [place] one drink equals one 12 oz bottle of beer (355 mL), one 5 oz glass of wine (148 mL), or one 1 oz glass of hard liquor (44 mL). General instructions  Schedule regular health, dental, and eye exams.  Stay current with your vaccines.  Tell your health care provider if: ? You often feel depressed. ? You have ever been abused or do not feel safe at home. Summary  Adopting a healthy lifestyle and getting preventive care are important in promoting health and wellness.  Follow your health care provider's instructions about healthy  diet, exercising, and getting tested or screened for diseases.  Follow your health care provider's instructions on monitoring your cholesterol and blood pressure. This information is not intended to replace advice given to you by your health care provider. Make sure you discuss any questions you have with your health care provider. Document Revised: 07/23/2018 Document Reviewed: 07/23/2018 [employer]vier Patient Education  2020 [employer]  

## 2020-05-23 NOTE — Progress Notes (Signed)
I,[name],acting as a Education administrator for [name], MD.,have documented all relevant documentation on the behalf of [name], MD,as directed by  [name], MD while in the presence of [name], MD.  This visit occurred during the SARS-CoV-2 public health emergency.  Safety protocols were in place, including screening questions prior to the visit, additional usage of staff PPE, and extensive cleaning of exam room while observing appropriate contact time as indicated for disinfecting solutions.  Subjective:     Patient ID: Brandy Ortega , female    DOB: [birth year] , 44 y.o.   MRN: [id number]   Chief Complaint  Patient presents with  . Annual Exam    HPI  She is here today for a full physical examination. She is followed by Dr. Jimmye Norman for her GYN exam. Her LMP was 03/25/2019. She has no specific concerns or complaints at this time.     Past Medical History:  Diagnosis Date  . Chiari I malformation ([place])   . Depression    Postpartum  . DSAP (disseminated superficial actinic porokeratosis)   . Generalized anxiety disorder   . Heart murmur   . Hypothyroid   . IBS (irritable bowel syndrome)   . NSVD (normal spontaneous vaginal delivery)    X3  . Ovarian cyst   . Palpitations   . Vitamin D deficiency      Family History  Problem Relation Age of Onset  . COPD Mother   . Hypertension Mother   . Hypothyroidism Mother   . Cancer Father        adrenal  . Asthma Sister   . Hypertension Sister   . Mental illness Sister   . Stroke Maternal Grandfather   . Heart disease Maternal Grandfather   . Diabetes Paternal Grandmother   . Diabetes Paternal Grandfather   . Heart disease Paternal Grandfather      Current Outpatient Medications:  .  albuterol (PROAIR HFA) 108 (90 Base) MCG/ACT inhaler, Take 2 puffs by mouth every 6 (six) hours as needed for wheezing or shortness of breath. , Disp: , Rfl:  .  Ascorbic Acid (VITAMIN C) 1000 MG tablet, Take  1,000 mg by mouth daily., Disp: , Rfl:  .  Cholecalciferol (VITAMIN D3) 125 MCG (5000 UT) TBDP, Take 1 capsule by mouth. , Disp: , Rfl:  .  ibuprofen (ADVIL,MOTRIN) 200 MG tablet, Take 800 mg by mouth every 6 (six) hours as needed for headache or mild pain., Disp: , Rfl:  .  levothyroxine (SYNTHROID) 75 MCG tablet, TAKE 1 TABLET BY MOUTH EVERY DAY FROM MONDAY TO THURSDAY, Disp: 30 tablet, Rfl: 3 .  levothyroxine (SYNTHROID) 88 MCG tablet, TAKE 1 TABLET BY MOUTH DAILY ON FRIDAY, SATURDAY AND SUNDAY, Disp: 30 tablet, Rfl: 2 .  magnesium gluconate (MAGONATE) 500 MG tablet, Take 500 mg by mouth at bedtime., Disp: , Rfl:  .  Omega-3 Fatty Acids (FISH OIL) 1000 MG CAPS, Take 2 capsules by mouth daily., Disp: , Rfl:  .  Progesterone Micronized (PROGESTERONE COMPOUNDING KIT TD), Take by mouth. , Disp: , Rfl:  .  Theanine 100 MG CAPS, Take 100 mg by mouth at bedtime., Disp: , Rfl:  .  valACYclovir (VALTREX) 500 MG tablet, TAKE 1 TABLET(500 MG) BY MOUTH TWICE DAILY AS NEEDED, Disp: 180 tablet, Rfl: 1 .  gabapentin (NEURONTIN) 100 MG capsule, Take 100 mg by mouth 2 (two) times daily., Disp: , Rfl:    Allergies  Allergen Reactions  . No  Known Allergies       The patient states she uses none for birth control. Last LMP was Patient's last menstrual period was 05/01/2020 (lmp unknown).. Negative for Dysmenorrhea. Negative for: breast discharge, breast lump(s), breast pain and breast self exam. Associated symptoms include abnormal vaginal bleeding. Pertinent negatives include abnormal bleeding (hematology), anxiety, decreased libido, depression, difficulty falling sleep, dyspareunia, history of infertility, nocturia, sexual dysfunction, sleep disturbances, urinary incontinence, urinary urgency, vaginal discharge and vaginal itching. Diet regular.    . The patient's tobacco use is:  Social History   Tobacco Use  Smoking Status Former Smoker  . Packs/day: 0.25  . Years: 3.00  . Pack years: 0.75  . Types:  Cigarettes  Smokeless Tobacco Never Used  Tobacco Comment   quit 30 years ago and smoked 3 years  . She has been exposed to passive smoke. The patient's alcohol use is:  Social History   Substance and Sexual Activity  Alcohol Use Yes   Comment: occasional wine    Review of Systems  Constitutional: Negative.   HENT: Negative.   Eyes: Negative.   Respiratory: Negative.   Cardiovascular: Negative.   Gastrointestinal: Negative.   Endocrine: Negative.   Genitourinary: Negative.   Musculoskeletal: Negative.   Skin: Negative.   Allergic/Immunologic: Negative.   Neurological: Negative.   Hematological: Negative.   Psychiatric/Behavioral: Negative.      Today's Vitals   05/23/20 1519  BP: 110/76  Pulse: 70  Temp: 98 F (36.7 C)  TempSrc: Oral  Weight: 166 lb 3.2 oz (75.4 kg)  Height: 5' 5"  (1.651 m)   Body mass index is 27.66 kg/m.  Wt Readings from Last 3 Encounters:  05/23/20 166 lb 3.2 oz (75.4 kg)  12/14/19 164 lb (74.4 kg)  10/05/19 171 lb 6.4 oz (77.7 kg)   Objective:  Physical Exam Vitals and nursing note reviewed.  Constitutional:      General: She is not in acute distress.    Appearance: Normal appearance. She is well-developed.  HENT:     Head: Normocephalic and atraumatic.     Right Ear: Hearing, tympanic membrane, ear canal and external ear normal. There is no impacted cerumen.     Left Ear: Hearing, tympanic membrane, ear canal and external ear normal. There is no impacted cerumen.     Nose:     Comments: Deferred, masked    Mouth/Throat:     Comments: Deferred, masked Eyes:     General: Lids are normal.     Extraocular Movements: Extraocular movements intact.     Conjunctiva/sclera: Conjunctivae normal.     Pupils: Pupils are equal, round, and reactive to light.     Funduscopic exam:    Right eye: No papilledema.        Left eye: No papilledema.  Neck:     Thyroid: No thyroid mass.     Vascular: No carotid bruit.  Cardiovascular:     Rate  and Rhythm: Normal rate and regular rhythm.     Pulses: Normal pulses.     Heart sounds: Normal heart sounds. No murmur heard.   Pulmonary:     Effort: Pulmonary effort is normal.     Breath sounds: Normal breath sounds.  Chest:     Breasts:        Right: Normal.        Left: Normal.  Abdominal:     General: Abdomen is flat. Bowel sounds are normal. There is no distension.     Palpations: Abdomen  is soft.     Tenderness: There is no abdominal tenderness.  Genitourinary:    Comments: deferred Musculoskeletal:        General: No swelling. Normal range of motion.     Cervical back: Full passive range of motion without pain, normal range of motion and neck supple.     Right lower leg: No edema.     Left lower leg: No edema.  Skin:    General: Skin is warm and dry.     Capillary Refill: Capillary refill takes less than 2 seconds.     Comments: Dry skin, with scattered areas of hyperpigmentation. No vesicular lesions noted.   Neurological:     General: No focal deficit present.     Mental Status: She is alert and oriented to person, place, and time.     Cranial Nerves: No cranial nerve deficit.     Sensory: No sensory deficit.  Psychiatric:        Mood and Affect: Mood normal.        Behavior: Behavior normal.        Thought Content: Thought content normal.        Judgment: Judgment normal.         Assessment And Plan:     1. Routine general medical examination at health care facility Comments: A full exam was performed. Importance of monthly self breast exams was discussed with the patient. PATIENT IS ADVISED TO GET 30-45 MINUTES REGULAR EXERCISE NO LESS THAN FOUR TO FIVE DAYS PER WEEK - BOTH WEIGHTBEARING EXERCISES AND AEROBIC ARE RECOMMENDED.  PATIENT IS ADVISED TO FOLLOW A HEALTHY DIET WITH AT LEAST SIX FRUITS/VEGGIES PER DAY, DECREASE INTAKE OF RED MEAT, AND TO INCREASE FISH INTAKE TO TWO DAYS PER WEEK.  MEATS/FISH SHOULD NOT BE FRIED, BAKED OR BROILED IS PREFERABLE.  I  SUGGEST WEARING SPF 50 SUNSCREEN ON EXPOSED PARTS AND ESPECIALLY WHEN IN THE DIRECT SUNLIGHT FOR AN EXTENDED PERIOD OF TIME.  PLEASE AVOID FAST FOOD RESTAURANTS AND INCREASE YOUR WATER INTAKE.  - Hepatitis C antibody - VITAMIN D 25 Hydroxy (Vit-D Deficiency, Fractures) - CBC - CMP14+EGFR - Lipid panel - TSH - T4, free  2. Encounter for screening mammogram for malignant neoplasm of breast - MM Digital Screening; Future     Patient was given opportunity to ask questions. Patient verbalized understanding of the plan and was able to repeat key elements of the plan. All questions were answered to their satisfaction.   [name], MD   I, [name], MD, have reviewed all documentation for this visit. The documentation on 06/05/20 for the exam, diagnosis, procedures, and orders are all accurate and complete.  THE PATIENT IS ENCOURAGED TO PRACTICE SOCIAL DISTANCING DUE TO THE COVID-19 PANDEMIC.

## 2020-05-24 LAB — CMP14+EGFR
ALT: 20 IU/L (ref 0–32)
AST: 16 IU/L (ref 0–40)
Albumin/Globulin Ratio: 2.1 (ref 1.2–2.2)
Albumin: 4.6 g/dL (ref 3.8–4.8)
Alkaline Phosphatase: 68 IU/L (ref 44–121)
BUN/Creatinine Ratio: 17 (ref 9–23)
BUN: 12 mg/dL (ref 6–24)
Bilirubin Total: 0.3 mg/dL (ref 0.0–1.2)
CO2: 24 mmol/L (ref 20–29)
Calcium: 9.4 mg/dL (ref 8.7–10.2)
Chloride: 105 mmol/L (ref 96–106)
Creatinine, Ser: 0.71 mg/dL (ref 0.57–1.00)
GFR calc Af Amer: 119 mL/min/{1.73_m2} (ref >59)
GFR calc non Af Amer: 103 mL/min/{1.73_m2} (ref >59)
Globulin, Total: 2.2 g/dL (ref 1.5–4.5)
Glucose: 79 mg/dL (ref 65–99)
Potassium: 4.1 mmol/L (ref 3.5–5.2)
Sodium: 141 mmol/L (ref 134–144)
Total Protein: 6.8 g/dL (ref 6.0–8.5)

## 2020-05-24 LAB — CBC
Hematocrit: 37.7 % (ref 34.0–46.6)
Hemoglobin: 12.3 g/dL (ref 11.1–15.9)
MCH: 30 pg (ref 26.6–33.0)
MCHC: 32.6 g/dL (ref 31.5–35.7)
MCV: 92 fL (ref 79–97)
Platelets: 252 10*3/uL (ref 150–450)
RBC: 4.1 x10E6/uL (ref 3.77–5.28)
RDW: 11.2 % — ABNORMAL LOW (ref 11.7–15.4)
WBC: 5.6 10*3/uL (ref 3.4–10.8)

## 2020-05-24 LAB — HEPATITIS C ANTIBODY: Hep C Virus Ab: <0.1 s/co ratio (ref 0.0–0.9)

## 2020-05-24 LAB — LIPID PANEL
Chol/HDL Ratio: 3 ratio (ref 0.0–4.4)
Cholesterol, Total: 193 mg/dL (ref 100–199)
HDL: 64 mg/dL (ref >39)
LDL Chol Calc (NIH): 113 mg/dL — ABNORMAL HIGH (ref 0–99)
Triglycerides: 89 mg/dL (ref 0–149)
VLDL Cholesterol Cal: 16 mg/dL (ref 5–40)

## 2020-05-24 LAB — T4, FREE: Free T4: 1.16 ng/dL (ref 0.82–1.77)

## 2020-05-24 LAB — TSH: TSH: 3.8 u[IU]/mL (ref 0.450–4.500)

## 2020-05-24 LAB — VITAMIN D 25 HYDROXY (VIT D DEFICIENCY, FRACTURES): Vit D, 25-Hydroxy: 60.6 ng/mL (ref 30.0–100.0)

## 2020-06-21 ENCOUNTER — Other Ambulatory Visit: Payer: Self-pay | Admitting: Internal Medicine

## 2020-06-21 ENCOUNTER — Encounter: Payer: Self-pay | Admitting: Internal Medicine

## 2020-06-21 MED ORDER — LEVOTHYROXINE SODIUM 75 MCG PO TABS
ORAL_TABLET | ORAL | 3 refills | Status: DC
Start: 2020-06-21 — End: 2021-03-13

## 2020-06-21 MED ORDER — LEVOTHYROXINE SODIUM 88 MCG PO TABS
ORAL_TABLET | ORAL | 2 refills | Status: DC
Start: 2020-06-21 — End: 2021-10-18

## 2020-06-22 ENCOUNTER — Other Ambulatory Visit: Payer: Self-pay

## 2020-06-22 DIAGNOSIS — E039 Hypothyroidism, unspecified: Secondary | ICD-10-CM

## 2020-07-11 ENCOUNTER — Encounter: Payer: Self-pay | Admitting: Internal Medicine

## 2020-08-02 ENCOUNTER — Encounter: Payer: Self-pay | Admitting: Internal Medicine

## 2020-08-02 ENCOUNTER — Ambulatory Visit: Payer: 59

## 2020-08-10 ENCOUNTER — Other Ambulatory Visit: Payer: 59

## 2020-08-10 ENCOUNTER — Other Ambulatory Visit: Payer: Self-pay

## 2020-08-10 DIAGNOSIS — E039 Hypothyroidism, unspecified: Secondary | ICD-10-CM

## 2020-08-11 LAB — TSH: TSH: 4.62 u[IU]/mL — ABNORMAL HIGH (ref 0.450–4.500)

## 2020-08-15 ENCOUNTER — Encounter: Payer: Self-pay | Admitting: Internal Medicine

## 2020-09-05 ENCOUNTER — Other Ambulatory Visit: Payer: Self-pay | Admitting: Internal Medicine

## 2020-09-05 DIAGNOSIS — Z1231 Encounter for screening mammogram for malignant neoplasm of breast: Secondary | ICD-10-CM

## 2020-09-06 ENCOUNTER — Ambulatory Visit: Payer: 59

## 2020-09-09 ENCOUNTER — Other Ambulatory Visit: Payer: Self-pay

## 2020-09-09 ENCOUNTER — Ambulatory Visit
Admission: RE | Admit: 2020-09-09 | Discharge: 2020-09-09 | Disposition: A | Discharge status: Home / Self Care | Payer: 59 | Source: Ambulatory Visit

## 2020-09-09 DIAGNOSIS — Z1231 Encounter for screening mammogram for malignant neoplasm of breast: Secondary | ICD-10-CM

## 2020-09-12 ENCOUNTER — Other Ambulatory Visit: Payer: Self-pay | Admitting: Internal Medicine

## 2020-09-12 DIAGNOSIS — R928 Other abnormal and inconclusive findings on diagnostic imaging of breast: Secondary | ICD-10-CM

## 2020-09-26 ENCOUNTER — Ambulatory Visit
Admission: RE | Admit: 2020-09-26 | Discharge: 2020-09-26 | Disposition: A | Discharge status: Home / Self Care | Payer: 59 | Source: Ambulatory Visit | Attending: Internal Medicine | Admitting: Internal Medicine

## 2020-09-26 ENCOUNTER — Other Ambulatory Visit: Payer: Self-pay | Admitting: Internal Medicine

## 2020-09-26 ENCOUNTER — Other Ambulatory Visit: Payer: Self-pay

## 2020-09-26 DIAGNOSIS — R928 Other abnormal and inconclusive findings on diagnostic imaging of breast: Secondary | ICD-10-CM

## 2020-10-05 ENCOUNTER — Ambulatory Visit
Admission: RE | Admit: 2020-10-05 | Discharge: 2020-10-05 | Disposition: A | Discharge status: Home / Self Care | Payer: 59 | Source: Ambulatory Visit | Attending: Internal Medicine | Admitting: Internal Medicine

## 2020-10-05 ENCOUNTER — Other Ambulatory Visit: Payer: Self-pay | Admitting: Internal Medicine

## 2020-10-05 ENCOUNTER — Other Ambulatory Visit: Payer: Self-pay

## 2020-10-05 DIAGNOSIS — R928 Other abnormal and inconclusive findings on diagnostic imaging of breast: Secondary | ICD-10-CM

## 2020-10-05 LAB — HM PAP SMEAR

## 2020-11-21 ENCOUNTER — Ambulatory Visit: Payer: 59 | Admitting: Internal Medicine

## 2020-11-21 ENCOUNTER — Other Ambulatory Visit: Payer: Self-pay

## 2020-11-21 ENCOUNTER — Encounter: Payer: Self-pay | Admitting: Internal Medicine

## 2020-11-21 VITALS — BP 112/74 | HR 109 | Temp 98.1°F | Ht 65.0 in | Wt 164.6 lb

## 2020-11-21 DIAGNOSIS — N926 Irregular menstruation, unspecified: Secondary | ICD-10-CM

## 2020-11-21 DIAGNOSIS — R000 Tachycardia, unspecified: Secondary | ICD-10-CM

## 2020-11-21 DIAGNOSIS — F419 Anxiety disorder, unspecified: Secondary | ICD-10-CM | POA: Diagnosis not present

## 2020-11-21 DIAGNOSIS — E039 Hypothyroidism, unspecified: Secondary | ICD-10-CM

## 2020-11-21 DIAGNOSIS — N959 Unspecified menopausal and perimenopausal disorder: Secondary | ICD-10-CM

## 2020-11-21 MED ORDER — DIAZEPAM 2 MG PO TABS
2.0000 mg | ORAL_TABLET | Freq: Two times a day (BID) | ORAL | 0 refills | Status: AC | PRN
Start: 2020-11-21 — End: ?

## 2020-11-21 NOTE — Progress Notes (Signed)
I,[name],acting as a Education administrator for [name], MD.,have documented all relevant documentation on the behalf of [name], MD,as directed by  [name], MD while in the presence of [name], MD.  This visit occurred during the SARS-CoV-2 public health emergency.  Safety protocols were in place, including screening questions prior to the visit, additional usage of staff PPE, and extensive cleaning of exam room while observing appropriate contact time as indicated for disinfecting solutions.  Subjective:     Patient ID: Brandy Ortega , female    DOB: 28-Feb-1976 , 45 y.o.   MRN: [id number]   Chief Complaint  Patient presents with  . Hormones f/u    HPI  The patient is here today for BHRT and thyroid f/u. She is using compounded Progesterone cream and taking levothyroxine for hypothyroidism. She is not having any issues with either form of treatment.     Past Medical History:  Diagnosis Date  . Chiari I malformation ([place])   . Depression    Postpartum  . DSAP (disseminated superficial actinic porokeratosis)   . Generalized anxiety disorder   . Heart murmur   . Hypothyroid   . IBS (irritable bowel syndrome)   . NSVD (normal spontaneous vaginal delivery)    X3  . Ovarian cyst   . Palpitations   . Vitamin D deficiency      Family History  Problem Relation Age of Onset  . COPD Mother   . Hypertension Mother   . Hypothyroidism Mother   . Cancer Father        adrenal  . Asthma Sister   . Hypertension Sister   . Mental illness Sister   . Stroke Maternal Grandfather   . Heart disease Maternal Grandfather   . Diabetes Paternal Grandmother   . Diabetes Paternal Grandfather   . Heart disease Paternal Grandfather      Current Outpatient Medications:  .  albuterol (VENTOLIN HFA) 108 (90 Base) MCG/ACT inhaler, Take 2 puffs by mouth every 6 (six) hours as needed for wheezing or shortness of breath. , Disp: , Rfl:  .  Ascorbic Acid (VITAMIN C)  1000 MG tablet, Take 1,000 mg by mouth daily., Disp: , Rfl:  .  Cholecalciferol (VITAMIN D3) 125 MCG (5000 UT) TBDP, Take 1 capsule by mouth. , Disp: , Rfl:  .  diazepam (VALIUM) 2 MG tablet, Take 1 tablet (2 mg total) by mouth every 12 (twelve) hours as needed for anxiety., Disp: 10 tablet, Rfl: 0 .  gabapentin (NEURONTIN) 100 MG capsule, Take 100 mg by mouth 2 (two) times daily., Disp: , Rfl:  .  ibuprofen (ADVIL,MOTRIN) 200 MG tablet, Take 800 mg by mouth every 6 (six) hours as needed for headache or mild pain., Disp: , Rfl:  .  levothyroxine (SYNTHROID) 75 MCG tablet, One tab po T-Th, Disp: 30 tablet, Rfl: 3 .  levothyroxine (SYNTHROID) 88 MCG tablet, Take one tab on F Sat Sun and Mon, Disp: 30 tablet, Rfl: 2 .  magnesium gluconate (MAGONATE) 500 MG tablet, Take 500 mg by mouth at bedtime., Disp: , Rfl:  .  Omega-3 Fatty Acids (FISH OIL) 1000 MG CAPS, Take 2 capsules by mouth daily., Disp: , Rfl:  .  Progesterone Micronized (PROGESTERONE COMPOUNDING KIT TD), Take by mouth. , Disp: , Rfl:  .  Theanine 100 MG CAPS, Take 100 mg by mouth at bedtime., Disp: , Rfl:  .  valACYclovir (VALTREX) 500 MG tablet, TAKE 1 TABLET(500 MG) BY MOUTH  TWICE DAILY AS NEEDED, Disp: 180 tablet, Rfl: 1   Allergies  Allergen Reactions  . No Known Allergies      Review of Systems  Constitutional: Negative.   Respiratory: Negative.   Cardiovascular: Negative.   Gastrointestinal: Negative.   Neurological: Negative.   Psychiatric/Behavioral: The patient is nervous/anxious.      Today's Vitals   11/21/20 1522  BP: 112/74  Pulse: (!) 109  Temp: 98.1 F (36.7 C)  TempSrc: Oral  Weight: 164 lb 9.6 oz (74.7 kg)  Height: 5' 5"  (1.651 m)   Body mass index is 27.39 kg/m.  Wt Readings from Last 3 Encounters:  11/21/20 164 lb 9.6 oz (74.7 kg)  05/23/20 166 lb 3.2 oz (75.4 kg)  12/14/19 164 lb (74.4 kg)   Objective:  Physical Exam Vitals and nursing note reviewed.  Constitutional:      Appearance: Normal  appearance.  HENT:     Head: Normocephalic and atraumatic.     Nose:     Comments: Masked     Mouth/Throat:     Comments: Masked  Cardiovascular:     Rate and Rhythm: Regular rhythm. Tachycardia present.     Heart sounds: Normal heart sounds.  Pulmonary:     Effort: Pulmonary effort is normal.     Breath sounds: Normal breath sounds.  Skin:    General: Skin is warm.  Neurological:     General: No focal deficit present.     Mental Status: She is alert.  Psychiatric:        Mood and Affect: Mood normal.        Behavior: Behavior normal.         Assessment And Plan:     1. Perimenopausal disorder Comments: She will c/w progesterone capsules 11OY on a cyclic regimen. She will f/u in 4 months for re-evaluation.   2. Hypothyroidism, adult Comments: I will check thyroid panel and adjust meds as needed. Due to tachycardia, will consider 60mg M-F and 865m on Sat/Sun as her new regimen.  - TSH - T4, Free  3. Anxiety Comments: She feels her sx have worsened slightly. Pt advised this could be due to thyroid meds. she may also benefit from GABA supplementation nightly.  - TSH  4. Tachycardia Comments: Possibly due to supratherapeutic thyroid supplementation. She is encouraged to stay well hydrated and to c/w magnesium supplementation. - TSH - Magnesium - CMP14+EGFR   Patient was given opportunity to ask questions. Patient verbalized understanding of the plan and was able to repeat key elements of the plan. All questions were answered to their satisfaction.   I, RoMaximino GreenlandMD, have reviewed all documentation for this visit. The documentation on 12/04/20 for the exam, diagnosis, procedures, and orders are all accurate and complete.   IF YOU HAVE BEEN REFERRED TO A SPECIALIST, IT MAY TAKE 1-2 WEEKS TO SCHEDULE/PROCESS THE REFERRAL. IF YOU HAVE NOT HEARD FROM US/SPECIALIST IN TWO WEEKS, PLEASE GIVE [place] CALL AT [phone].   THE PATIENT IS ENCOURAGED TO PRACTICE  SOCIAL DISTANCING DUE TO THE COVID-19 PANDEMIC.

## 2020-11-22 LAB — CMP14+EGFR
ALT: 14 IU/L (ref 0–32)
AST: 18 IU/L (ref 0–40)
Albumin/Globulin Ratio: 1.9 (ref 1.2–2.2)
Albumin: 4.5 g/dL (ref 3.8–4.8)
Alkaline Phosphatase: 61 IU/L (ref 44–121)
BUN/Creatinine Ratio: 18 (ref 9–23)
BUN: 13 mg/dL (ref 6–24)
Bilirubin Total: 0.3 mg/dL (ref 0.0–1.2)
CO2: 20 mmol/L (ref 20–29)
Calcium: 9.4 mg/dL (ref 8.7–10.2)
Chloride: 103 mmol/L (ref 96–106)
Creatinine, Ser: 0.74 mg/dL (ref 0.57–1.00)
Globulin, Total: 2.4 g/dL (ref 1.5–4.5)
Glucose: 88 mg/dL (ref 65–99)
Potassium: 4.6 mmol/L (ref 3.5–5.2)
Sodium: 141 mmol/L (ref 134–144)
Total Protein: 6.9 g/dL (ref 6.0–8.5)
eGFR: 101 mL/min/{1.73_m2} (ref >59)

## 2020-11-22 LAB — MAGNESIUM: Magnesium: 2 mg/dL (ref 1.6–2.3)

## 2020-11-22 LAB — T4, FREE: Free T4: 1.27 ng/dL (ref 0.82–1.77)

## 2020-11-22 LAB — TSH: TSH: 3.6 u[IU]/mL (ref 0.450–4.500)

## 2020-12-04 DIAGNOSIS — R000 Tachycardia, unspecified: Secondary | ICD-10-CM | POA: Insufficient documentation

## 2020-12-04 DIAGNOSIS — N959 Unspecified menopausal and perimenopausal disorder: Secondary | ICD-10-CM | POA: Insufficient documentation

## 2020-12-04 DIAGNOSIS — F419 Anxiety disorder, unspecified: Secondary | ICD-10-CM | POA: Insufficient documentation

## 2020-12-13 ENCOUNTER — Encounter: Payer: Self-pay | Admitting: Internal Medicine

## 2021-03-11 ENCOUNTER — Other Ambulatory Visit: Payer: Self-pay | Admitting: Internal Medicine

## 2021-03-16 ENCOUNTER — Encounter: Payer: Self-pay | Admitting: Internal Medicine

## 2021-03-16 ENCOUNTER — Other Ambulatory Visit: Payer: Self-pay

## 2021-03-16 ENCOUNTER — Ambulatory Visit: Payer: 59 | Admitting: Internal Medicine

## 2021-03-16 VITALS — BP 112/68 | HR 68 | Temp 97.9°F | Ht 65.2 in | Wt 158.8 lb

## 2021-03-16 DIAGNOSIS — R1013 Epigastric pain: Secondary | ICD-10-CM

## 2021-03-16 DIAGNOSIS — Z1211 Encounter for screening for malignant neoplasm of colon: Secondary | ICD-10-CM

## 2021-03-16 DIAGNOSIS — Z6826 Body mass index (BMI) 26.0-26.9, adult: Secondary | ICD-10-CM

## 2021-03-16 MED ORDER — FAMOTIDINE 20 MG PO TABS: 20.0000 mg | ORAL_TABLET | Freq: Two times a day (BID) | ORAL | 1 refills | Status: DC

## 2021-03-16 NOTE — Progress Notes (Signed)
I,Yamilka [employer] as a Education administrator for [name], MD.,have documented all relevant documentation on the behalf of [name], MD,as directed by  [name], MD while in the presence of [name], MD.  This visit occurred during the SARS-CoV-2 public health emergency.  Safety protocols were in place, including screening questions prior to the visit, additional usage of staff PPE, and extensive cleaning of exam room while observing appropriate contact time as indicated for disinfecting solutions.  Subjective:     Patient ID: Brandy Ortega , female    DOB: [birth year] , 45 y.o.   MRN: [id number]   Chief Complaint  Patient presents with   Abdominal Pain    HPI  She presents today for further evaluation of abdominal pain. She states her sx started mid-July - she had eaten pizza for lunch and then a steak sub later in the day. Later that night, she developed fever. She does not think this was due to food poisoning. There was associated nausea. She admits that this is not her usual diet. She later decided to switch to FODMAP diet - there has been some improvement in her sx; however, they have not completely resolved.   Abdominal Pain This is a recurrent problem. The current episode started 1 to 4 weeks ago. The onset quality is sudden. The problem occurs daily. The problem has been gradually improving. The pain is located in the epigastric region. The pain is severe. The quality of the pain is burning, cramping and sharp. The abdominal pain radiates to the left flank. Associated symptoms include a fever and nausea. The pain is aggravated by eating. Relieved by: changed her diet. She has tried acetaminophen for the symptoms. The treatment provided moderate relief.    Past Medical History:  Diagnosis Date   Chiari I malformation ([place])    Depression    Postpartum   DSAP (disseminated superficial actinic porokeratosis)    Generalized anxiety disorder    Heart  murmur    Hypothyroid    IBS (irritable bowel syndrome)    NSVD (normal spontaneous vaginal delivery)    X3   Ovarian cyst    Palpitations    Vitamin D deficiency      Family History  Problem Relation Age of Onset   COPD Mother    Hypertension Mother    Hypothyroidism Mother    Cancer Father        adrenal   Asthma Sister    Hypertension Sister    Mental illness Sister    Stroke Maternal Grandfather    Heart disease Maternal Grandfather    Diabetes Paternal Grandmother    Diabetes Paternal Grandfather    Heart disease Paternal Grandfather      Current Outpatient Medications:    albuterol (VENTOLIN HFA) 108 (90 Base) MCG/ACT inhaler, Take 2 puffs by mouth every 6 (six) hours as needed for wheezing or shortness of breath. , Disp: , Rfl:    Ascorbic Acid (VITAMIN C) 1000 MG tablet, Take 1,000 mg by mouth daily., Disp: , Rfl:    Cholecalciferol (VITAMIN D3) 125 MCG (5000 UT) TBDP, Take 1 capsule by mouth. , Disp: , Rfl:    famotidine (PEPCID) 20 MG tablet, Take 1 tablet (20 mg total) by mouth 2 (two) times daily., Disp: 60 tablet, Rfl: 1   gabapentin (NEURONTIN) 100 MG capsule, Take 100 mg by mouth 2 (two) times daily., Disp: , Rfl:    ibuprofen (ADVIL,MOTRIN) 200 MG tablet, Take 800  mg by mouth every 6 (six) hours as needed for headache or mild pain., Disp: , Rfl:    levothyroxine (SYNTHROID) 75 MCG tablet, TAKE 1 TABLET BY MOUTH TUESDAY-THURSDAY, Disp: 30 tablet, Rfl: 3   levothyroxine (SYNTHROID) 88 MCG tablet, Take one tab on F Sat Sun and Mon, Disp: 30 tablet, Rfl: 2   magnesium gluconate (MAGONATE) 500 MG tablet, Take 500 mg by mouth at bedtime., Disp: , Rfl:    Omega-3 Fatty Acids (FISH OIL) 1000 MG CAPS, Take 2 capsules by mouth daily., Disp: , Rfl:    Progesterone Micronized (PROGESTERONE COMPOUNDING KIT TD), Take by mouth. , Disp: , Rfl:    Theanine 100 MG CAPS, Take 100 mg by mouth at bedtime., Disp: , Rfl:    valACYclovir (VALTREX) 500 MG tablet, TAKE 1 TABLET(500 MG)  BY MOUTH TWICE DAILY AS NEEDED, Disp: 180 tablet, Rfl: 1   diazepam (VALIUM) 2 MG tablet, Take 1 tablet (2 mg total) by mouth every 12 (twelve) hours as needed for anxiety. (Patient not taking: Reported on 03/16/2021), Disp: 10 tablet, Rfl: 0   Allergies  Allergen Reactions   No Known Allergies      Review of Systems  Constitutional:  Positive for fever.  Gastrointestinal:  Positive for abdominal pain and nausea.    Today's Vitals   03/16/21 1618  BP: 112/68  Pulse: 68  Temp: 97.9 F (36.6 C)  Weight: 158 lb 12.8 oz (72 kg)  Height: 5' 5.2" (1.656 m)  PainSc: 3   PainLoc: Abdomen   Body mass index is 26.26 kg/m.  Wt Readings from Last 3 Encounters:  03/16/21 158 lb 12.8 oz (72 kg)  11/21/20 164 lb 9.6 oz (74.7 kg)  05/23/20 166 lb 3.2 oz (75.4 kg)    Objective:  Physical Exam Vitals and nursing note reviewed.  Constitutional:      Appearance: Normal appearance.  HENT:     Head: Normocephalic and atraumatic.  Eyes:     Extraocular Movements: Extraocular movements intact.  Cardiovascular:     Rate and Rhythm: Normal rate and regular rhythm.     Heart sounds: Normal heart sounds.  Pulmonary:     Effort: Pulmonary effort is normal.     Breath sounds: Normal breath sounds.  Abdominal:     General: Abdomen is flat. Bowel sounds are normal.     Palpations: Abdomen is soft.     Tenderness: There is abdominal tenderness in the epigastric area and periumbilical area. There is no right CVA tenderness, left CVA tenderness or rebound.  Skin:    General: Skin is warm.  Neurological:     General: No focal deficit present.     Mental Status: She is alert.  Psychiatric:        Mood and Affect: Mood normal.        Behavior: Behavior normal.        Assessment And Plan:     1. Epigastric pain Comments: I will start her on famotidine twice daily. I will schedule her for u/s and refer her to GI for further evaluation should her sx persist.  - Amylase - Lipase - FAST [place];  Future - Ambulatory referral to Gastroenterology  2. BMI 26.0-26.9,adult Comments: BMI is stable. Encouraged to aim for at least 150 minutes of exercise per week.   3. Screening for colon cancer Comments: I will refer her to GI for CRC screening.  - Ambulatory referral to Gastroenterology    Patient was given opportunity to ask  questions. Patient verbalized understanding of the plan and was able to repeat key elements of the plan. All questions were answered to their satisfaction.   I, [name], MD, have reviewed all documentation for this visit. The documentation on 03/21/21 for the exam, diagnosis, procedures, and orders are all accurate and complete.   IF YOU HAVE BEEN REFERRED TO A SPECIALIST, IT MAY TAKE 1-2 WEEKS TO SCHEDULE/PROCESS THE REFERRAL. IF YOU HAVE NOT HEARD FROM US/SPECIALIST IN TWO WEEKS, PLEASE GIVE [place] A CALL AT [phone].   THE PATIENT IS ENCOURAGED TO PRACTICE SOCIAL DISTANCING DUE TO THE COVID-19 PANDEMIC.

## 2021-03-20 LAB — AMYLASE: Amylase: 79 U/L (ref 31–110)

## 2021-03-20 LAB — LIPASE: Lipase: 54 U/L (ref 14–72)

## 2021-03-21 ENCOUNTER — Encounter: Payer: Self-pay | Admitting: Internal Medicine

## 2021-03-23 ENCOUNTER — Encounter: Payer: Self-pay | Admitting: Internal Medicine

## 2021-03-27 ENCOUNTER — Other Ambulatory Visit: Payer: Self-pay | Admitting: Internal Medicine

## 2021-03-27 DIAGNOSIS — R1013 Epigastric pain: Secondary | ICD-10-CM

## 2021-04-05 ENCOUNTER — Ambulatory Visit
Admission: RE | Admit: 2021-04-05 | Discharge: 2021-04-05 | Disposition: A | Discharge status: Home / Self Care | Payer: 59 | Source: Ambulatory Visit | Attending: Internal Medicine | Admitting: Internal Medicine

## 2021-04-05 DIAGNOSIS — R1013 Epigastric pain: Secondary | ICD-10-CM

## 2021-04-09 ENCOUNTER — Encounter: Payer: Self-pay | Admitting: Internal Medicine

## 2021-05-09 ENCOUNTER — Other Ambulatory Visit: Payer: Self-pay | Admitting: Internal Medicine

## 2021-06-06 ENCOUNTER — Other Ambulatory Visit: Payer: Self-pay

## 2021-06-06 ENCOUNTER — Encounter: Payer: Self-pay | Admitting: Internal Medicine

## 2021-06-06 ENCOUNTER — Ambulatory Visit (INDEPENDENT_AMBULATORY_CARE_PROVIDER_SITE_OTHER): Payer: 59 | Admitting: Internal Medicine

## 2021-06-06 VITALS — BP 104/68 | HR 77 | Temp 97.9°F | Ht 66.4 in | Wt 155.4 lb

## 2021-06-06 DIAGNOSIS — Z0000 Encounter for general adult medical examination without abnormal findings: Secondary | ICD-10-CM | POA: Diagnosis not present

## 2021-06-06 DIAGNOSIS — E559 Vitamin D deficiency, unspecified: Secondary | ICD-10-CM | POA: Diagnosis not present

## 2021-06-06 DIAGNOSIS — M89319 Hypertrophy of bone, unspecified shoulder: Secondary | ICD-10-CM | POA: Diagnosis not present

## 2021-06-06 DIAGNOSIS — E039 Hypothyroidism, unspecified: Secondary | ICD-10-CM | POA: Diagnosis not present

## 2021-06-06 DIAGNOSIS — M4185 Other forms of scoliosis, thoracolumbar region: Secondary | ICD-10-CM

## 2021-06-06 DIAGNOSIS — N959 Unspecified menopausal and perimenopausal disorder: Secondary | ICD-10-CM | POA: Diagnosis not present

## 2021-06-06 MED ORDER — GABAPENTIN 100 MG PO CAPS: 100.0000 mg | ORAL_CAPSULE | Freq: Two times a day (BID) | ORAL | 0 refills | Status: AC

## 2021-06-06 NOTE — Progress Notes (Signed)
I,[name],acting as a Education administrator for Brandy Greenland, MD.,have documented all relevant documentation on the behalf of Brandy Greenland, MD,as directed by  Brandy Greenland, MD while in the presence of Brandy Greenland, MD.  This visit occurred during the SARS-CoV-2 public health emergency.  Safety protocols were in place, including screening questions prior to the visit, additional usage of staff PPE, and extensive cleaning of exam room while observing appropriate contact time as indicated for disinfecting solutions.  Subjective:     Patient ID: Brandy Ortega , female    DOB: 1976-02-14 , 45 y.o.   MRN: [id number]   Chief Complaint  Patient presents with   Annual Exam    HPI  She is here today for a full physical examination. She is followed by Dr. Jimmye Norman for her GYN exam. Her LMP was February 2022. She has no specific concerns or complaints at this time.     Past Medical History:  Diagnosis Date   Chiari I malformation ([place])    Depression    Postpartum   DSAP (disseminated superficial actinic porokeratosis)    Generalized anxiety disorder    Heart murmur    Hypothyroid    IBS (irritable bowel syndrome)    NSVD (normal spontaneous vaginal delivery)    X3   Ovarian cyst    Palpitations    Vitamin D deficiency      Family History  Problem Relation Age of Onset   COPD Mother    Hypertension Mother    Hypothyroidism Mother    Cancer Father        adrenal   Asthma Sister    Hypertension Sister    Mental illness Sister    Stroke Maternal Grandfather    Heart disease Maternal Grandfather    Diabetes Paternal Grandmother    Diabetes Paternal Grandfather    Heart disease Paternal Grandfather      Current Outpatient Medications:    albuterol (VENTOLIN HFA) 108 (90 Base) MCG/ACT inhaler, Take 2 puffs by mouth every 6 (six) hours as needed for wheezing or shortness of breath. , Disp: , Rfl:    Ascorbic Acid (VITAMIN C) 1000 MG tablet, Take 1,000 mg by mouth  daily., Disp: , Rfl:    Cholecalciferol (VITAMIN D3) 125 MCG (5000 UT) TBDP, Take 1 capsule by mouth. , Disp: , Rfl:    ibuprofen (ADVIL,MOTRIN) 200 MG tablet, Take 800 mg by mouth every 6 (six) hours as needed for headache or mild pain., Disp: , Rfl:    levothyroxine (SYNTHROID) 75 MCG tablet, TAKE 1 TABLET BY MOUTH TUESDAY-THURSDAY, Disp: 30 tablet, Rfl: 3   levothyroxine (SYNTHROID) 88 MCG tablet, Take one tab on F Sat Sun and Mon, Disp: 30 tablet, Rfl: 2   magnesium gluconate (MAGONATE) 500 MG tablet, Take 500 mg by mouth at bedtime. Sometimes 2 per day, Disp: , Rfl:    Omega-3 Fatty Acids (FISH OIL) 1000 MG CAPS, Take 2 capsules by mouth daily. Couple times per week, Disp: , Rfl:    Progesterone Micronized (PROGESTERONE COMPOUNDING KIT TD), Take by mouth. , Disp: , Rfl:    Theanine 100 MG CAPS, Take 100 mg by mouth at bedtime., Disp: , Rfl:    valACYclovir (VALTREX) 500 MG tablet, TAKE 1 TABLET(500 MG) BY MOUTH TWICE DAILY AS NEEDED, Disp: 180 tablet, Rfl: 1   diazepam (VALIUM) 2 MG tablet, Take 1 tablet (2 mg total) by mouth every 12 (twelve) hours as needed for anxiety. (Patient not taking: Reported  on 06/06/2021), Disp: 10 tablet, Rfl: 0   gabapentin (NEURONTIN) 100 MG capsule, Take 1 capsule (100 mg total) by mouth 2 (two) times daily., Disp: 180 capsule, Rfl: 0   Allergies  Allergen Reactions   No Known Allergies     The patient states she uses vasectomy for birth control. Last LMP was Patient's last menstrual period was 05/26/2021.. Negative for Dysmenorrhea. Negative for: breast discharge, breast lump(s), breast pain and breast self exam. Associated symptoms include abnormal vaginal bleeding. Pertinent negatives include abnormal bleeding (hematology), anxiety, decreased libido, depression, difficulty falling sleep, dyspareunia, history of infertility, nocturia, sexual dysfunction, sleep disturbances, urinary incontinence, urinary urgency, vaginal discharge and vaginal itching. Diet  regular.The patient states her exercise level is  once weekly.   . The patient's tobacco use is:  Social History   Tobacco Use  Smoking Status Former   Packs/day: 0.25   Years: 3.00   Pack years: 0.75   Types: Cigarettes  Smokeless Tobacco Never  Tobacco Comments   quit 30 years ago and smoked 3 years  . She has been exposed to passive smoke. The patient's alcohol use is:  Social History   Substance and Sexual Activity  Alcohol Use Yes   Comment: occasional wine    Review of Systems  Constitutional: Negative.   HENT: Negative.    Eyes: Negative.   Respiratory: Negative.    Cardiovascular: Negative.   Gastrointestinal: Negative.   Endocrine: Negative.   Genitourinary: Negative.   Musculoskeletal: Negative.        She c/o left clavicle swelling. Denies fall/trauma. Denies any recent injury. Not sure what may have triggered her sx.   Skin: Negative.   Allergic/Immunologic: Negative.   Neurological: Negative.   Hematological: Negative.   Psychiatric/Behavioral: Negative.      Today's Vitals   06/06/21 0916  BP: 104/68  Pulse: 77  Temp: 97.9 F (36.6 C)  Weight: 155 lb 6.4 oz (70.5 kg)  Height: 5' 6.4" (1.687 m)   Body mass index is 24.78 kg/m.  Wt Readings from Last 3 Encounters:  06/06/21 155 lb 6.4 oz (70.5 kg)  03/16/21 158 lb 12.8 oz (72 kg)  11/21/20 164 lb 9.6 oz (74.7 kg)    BP Readings from Last 3 Encounters:  06/06/21 104/68  03/16/21 112/68  11/21/20 112/74    Objective:  Physical Exam Vitals and nursing note reviewed.  Constitutional:      Appearance: Normal appearance.  HENT:     Head: Normocephalic and atraumatic.     Right Ear: Tympanic membrane, ear canal and external ear normal.     Left Ear: Tympanic membrane, ear canal and external ear normal.     Nose:     Comments: Masked     Mouth/Throat:     Comments: Masked  Eyes:     Extraocular Movements: Extraocular movements intact.     Conjunctiva/sclera: Conjunctivae normal.      Pupils: Pupils are equal, round, and reactive to light.  Cardiovascular:     Rate and Rhythm: Normal rate and regular rhythm.     Pulses: Normal pulses.     Heart sounds: Normal heart sounds.  Pulmonary:     Effort: Pulmonary effort is normal.     Breath sounds: Normal breath sounds.  Chest:  Breasts:    Tanner Score is 5.     Right: Normal.     Left: Normal.       Comments: Bony prominence left clavicle, slightly tender to palpation Abdominal:  General: Abdomen is flat. Bowel sounds are normal.     Palpations: Abdomen is soft.  Genitourinary:    Comments: deferred Musculoskeletal:        General: Normal range of motion.     Cervical back: Normal range of motion and neck supple.     Comments: Swelling of left sternoclavicular joint. No overlying erythema.   Skin:    General: Skin is warm and dry.     Comments: Scattered areas of hyperpigmentated lesions, dry skin, not erythematous. No vesicular lesions noted.   Neurological:     General: No focal deficit present.     Mental Status: She is alert and oriented to person, place, and time.  Psychiatric:        Mood and Affect: Mood normal.        Behavior: Behavior normal.        Assessment And Plan:     1. Routine general medical examination at health care facility Comments: A full exam was performed. Importance of monthly self breast exams was discussed with the patient. PATIENT IS ADVISED TO GET 30-45 MINUTES REGULAR EXERCISE NO LESS THAN FOUR TO FIVE DAYS PER WEEK - BOTH WEIGHTBEARING EXERCISES AND AEROBIC ARE RECOMMENDED.  PATIENT IS ADVISED TO FOLLOW A HEALTHY DIET WITH AT LEAST SIX FRUITS/VEGGIES PER DAY, DECREASE INTAKE OF RED MEAT, AND TO INCREASE FISH INTAKE TO TWO DAYS PER WEEK.  MEATS/FISH SHOULD NOT BE FRIED, BAKED OR BROILED IS PREFERABLE.  IT IS ALSO IMPORTANT TO CUT BACK ON YOUR SUGAR INTAKE. PLEASE AVOID ANYTHING WITH ADDED SUGAR, CORN SYRUP OR OTHER SWEETENERS. IF YOU MUST USE A SWEETENER, YOU CAN TRY STEVIA.  IT IS ALSO IMPORTANT TO AVOID ARTIFICIALLY SWEETENERS AND DIET BEVERAGES. LASTLY, I SUGGEST WEARING SPF 50 SUNSCREEN ON EXPOSED PARTS AND ESPECIALLY WHEN IN THE DIRECT SUNLIGHT FOR AN EXTENDED PERIOD OF TIME.  PLEASE AVOID FAST FOOD RESTAURANTS AND INCREASE YOUR WATER INTAKE.  - CBC - CMP14+EGFR - Lipid panel - TSH + free T4  2. Acquired hypothyroidism Comments: I will check thyroid panel and adjust meds as needed.   3. Perimenopausal disorder Comments: Chronic, she will continue with progesterone supplementation.   4. Clavicle enlargement Comments: I will order clavicle x-ray for further evaluation.  - DG Clavicle Left; Future  5. Vitamin D deficiency disease Comments: I will check vitamin D level and supplement as needed.  - Vitamin D (25 hydroxy)  6. Neuromuscular scoliosis of thoracolumbar region Chronic, she is also followed by Ortho.   Patient was given opportunity to ask questions. Patient verbalized understanding of the plan and was able to repeat key elements of the plan. All questions were answered to their satisfaction.   I, Brandy Greenland, MD, have reviewed all documentation for this visit. The documentation on 06/13/21 for the exam, diagnosis, procedures, and orders are all accurate and complete.   THE PATIENT IS ENCOURAGED TO PRACTICE SOCIAL DISTANCING DUE TO THE COVID-19 PANDEMIC.

## 2021-06-06 NOTE — Patient Instructions (Signed)
Health Maintenance, Female Adopting a healthy lifestyle and getting preventive care are important in promoting health and wellness. Ask your health care provider about: The right schedule for you to have regular tests and exams. Things you can do on your own to prevent diseases and keep yourself healthy. What should I know about diet, weight, and exercise? Eat a healthy diet  Eat a diet that includes plenty of vegetables, fruits, low-fat dairy products, and lean protein. Do not eat a lot of foods that are high in solid fats, added sugars, or sodium. Maintain a healthy weight Body mass index (BMI) is used to identify weight problems. It estimates body fat based on height and weight. Your health care provider can help determine your BMI and help you achieve or maintain a healthy weight. Get regular exercise Get regular exercise. This is one of the most important things you can do for your health. Most adults should: Exercise for at least 150 minutes each week. The exercise should increase your heart rate and make you sweat (moderate-intensity exercise). Do strengthening exercises at least twice a week. This is in addition to the moderate-intensity exercise. Spend less time sitting. Even light physical activity can be beneficial. Watch cholesterol and blood lipids Have your blood tested for lipids and cholesterol at 46 years of age, then have this test every 5 years. Have your cholesterol levels checked more often if: Your lipid or cholesterol levels are high. You are older than 46 years of age. You are at high risk for heart disease. What should I know about cancer screening? Depending on your health history and family history, you may need to have cancer screening at various ages. This may include screening for: Breast cancer. Cervical cancer. Colorectal cancer. Skin cancer. Lung cancer. What should I know about heart disease, diabetes, and high blood pressure? Blood pressure and heart  disease High blood pressure causes heart disease and increases the risk of stroke. This is more likely to develop in people who have high blood pressure readings, are of African descent, or are overweight. Have your blood pressure checked: Every 3-5 years if you are 18-39 years of age. Every year if you are 40 years old or older. Diabetes Have regular diabetes screenings. This checks your fasting blood sugar level. Have the screening done: Once every three years after age 40 if you are at a normal weight and have a low risk for diabetes. More often and at a younger age if you are overweight or have a high risk for diabetes. What should I know about preventing infection? Hepatitis B If you have a higher risk for hepatitis B, you should be screened for this virus. Talk with your health care provider to find out if you are at risk for hepatitis B infection. Hepatitis C Testing is recommended for: Everyone born from 1945 through 1965. Anyone with known risk factors for hepatitis C. Sexually transmitted infections (STIs) Get screened for STIs, including gonorrhea and chlamydia, if: You are sexually active and are younger than 46 years of age. You are older than 46 years of age and your health care provider tells you that you are at risk for this type of infection. Your sexual activity has changed since you were last screened, and you are at increased risk for chlamydia or gonorrhea. Ask your health care provider if you are at risk. Ask your health care provider about whether you are at high risk for HIV. Your health care provider may recommend a prescription medicine   to help prevent HIV infection. If you choose to take medicine to prevent HIV, you should first get tested for HIV. You should then be tested every 3 months for as long as you are taking the medicine. Pregnancy If you are about to stop having your period (premenopausal) and you may become pregnant, seek counseling before you get  pregnant. Take 400 to 800 micrograms (mcg) of folic acid every day if you become pregnant. Ask for birth control (contraception) if you want to prevent pregnancy. Osteoporosis and menopause Osteoporosis is a disease in which the bones lose minerals and strength with aging. This can result in bone fractures. If you are 65 years old or older, or if you are at risk for osteoporosis and fractures, ask your health care provider if you should: Be screened for bone loss. Take a calcium or vitamin D supplement to lower your risk of fractures. Be given hormone replacement therapy (HRT) to treat symptoms of menopause. Follow these instructions at home: Lifestyle Do not use any products that contain nicotine or tobacco, such as cigarettes, e-cigarettes, and chewing tobacco. If you need help quitting, ask your health care provider. Do not use street drugs. Do not share needles. Ask your health care provider for help if you need support or information about quitting drugs. Alcohol use Do not drink alcohol if: Your health care provider tells you not to drink. You are pregnant, may be pregnant, or are planning to become pregnant. If you drink alcohol: Limit how much you use to 0-1 drink a day. Limit intake if you are breastfeeding. Be aware of how much alcohol is in your drink. In the [place] one drink equals one 12 oz bottle of beer (355 mL), one 5 oz glass of wine (148 mL), or one 1 oz glass of hard liquor (44 mL). General instructions Schedule regular health, dental, and eye exams. Stay current with your vaccines. Tell your health care provider if: You often feel depressed. You have ever been abused or do not feel safe at home. Summary Adopting a healthy lifestyle and getting preventive care are important in promoting health and wellness. Follow your health care provider's instructions about healthy diet, exercising, and getting tested or screened for diseases. Follow your health care provider's  instructions on monitoring your cholesterol and blood pressure. This information is not intended to replace advice given to you by your health care provider. Make sure you discuss any questions you have with your health care provider. Document Revised: 10/07/2020 Document Reviewed: 07/23/2018 Elsevier Patient Education  2022 [employer] Inc[employer]  

## 2021-06-07 LAB — CMP14+EGFR
ALT: 15 IU/L (ref 0–32)
AST: 18 IU/L (ref 0–40)
Albumin/Globulin Ratio: 1.8 (ref 1.2–2.2)
Albumin: 4.4 g/dL (ref 3.8–4.8)
Alkaline Phosphatase: 75 IU/L (ref 44–121)
BUN/Creatinine Ratio: 13 (ref 9–23)
BUN: 9 mg/dL (ref 6–24)
Bilirubin Total: 0.3 mg/dL (ref 0.0–1.2)
CO2: 23 mmol/L (ref 20–29)
Calcium: 9.2 mg/dL (ref 8.7–10.2)
Chloride: 103 mmol/L (ref 96–106)
Creatinine, Ser: 0.69 mg/dL (ref 0.57–1.00)
Globulin, Total: 2.4 g/dL (ref 1.5–4.5)
Glucose: 87 mg/dL (ref 70–99)
Potassium: 4.7 mmol/L (ref 3.5–5.2)
Sodium: 139 mmol/L (ref 134–144)
Total Protein: 6.8 g/dL (ref 6.0–8.5)
eGFR: 108 mL/min/{1.73_m2} (ref >59)

## 2021-06-07 LAB — LIPID PANEL
Chol/HDL Ratio: 3.1 ratio (ref 0.0–4.4)
Cholesterol, Total: 187 mg/dL (ref 100–199)
HDL: 60 mg/dL (ref >39)
LDL Chol Calc (NIH): 114 mg/dL — ABNORMAL HIGH (ref 0–99)
Triglycerides: 69 mg/dL (ref 0–149)
VLDL Cholesterol Cal: 13 mg/dL (ref 5–40)

## 2021-06-07 LAB — CBC
Hematocrit: 41.5 % (ref 34.0–46.6)
Hemoglobin: 13.6 g/dL (ref 11.1–15.9)
MCH: 30.4 pg (ref 26.6–33.0)
MCHC: 32.8 g/dL (ref 31.5–35.7)
MCV: 93 fL (ref 79–97)
Platelets: 274 10*3/uL (ref 150–450)
RBC: 4.48 x10E6/uL (ref 3.77–5.28)
RDW: 11.8 % (ref 11.7–15.4)
WBC: 5.8 10*3/uL (ref 3.4–10.8)

## 2021-06-07 LAB — TSH+FREE T4
Free T4: 1.05 ng/dL (ref 0.82–1.77)
TSH: 3.73 u[IU]/mL (ref 0.450–4.500)

## 2021-06-07 LAB — VITAMIN D 25 HYDROXY (VIT D DEFICIENCY, FRACTURES): Vit D, 25-Hydroxy: 59.6 ng/mL (ref 30.0–100.0)

## 2021-06-13 ENCOUNTER — Encounter: Payer: Self-pay | Admitting: Internal Medicine

## 2021-06-26 ENCOUNTER — Ambulatory Visit
Admission: RE | Admit: 2021-06-26 | Discharge: 2021-06-26 | Disposition: A | Discharge status: Home / Self Care | Payer: 59 | Source: Ambulatory Visit | Attending: Internal Medicine | Admitting: Internal Medicine

## 2021-06-26 DIAGNOSIS — M89319 Hypertrophy of bone, unspecified shoulder: Secondary | ICD-10-CM

## 2021-07-04 ENCOUNTER — Ambulatory Visit: Payer: 59

## 2021-10-16 ENCOUNTER — Ambulatory Visit: Payer: 59 | Admitting: Internal Medicine

## 2021-10-16 ENCOUNTER — Encounter: Payer: Self-pay | Admitting: Internal Medicine

## 2021-10-16 ENCOUNTER — Other Ambulatory Visit: Payer: Self-pay

## 2021-10-16 VITALS — BP 110/78 | HR 68 | Temp 98.3°F | Ht 66.4 in | Wt 159.6 lb

## 2021-10-16 DIAGNOSIS — Z1211 Encounter for screening for malignant neoplasm of colon: Secondary | ICD-10-CM | POA: Diagnosis not present

## 2021-10-16 DIAGNOSIS — E039 Hypothyroidism, unspecified: Secondary | ICD-10-CM

## 2021-10-16 DIAGNOSIS — N959 Unspecified menopausal and perimenopausal disorder: Secondary | ICD-10-CM | POA: Diagnosis not present

## 2021-10-16 DIAGNOSIS — Z6825 Body mass index (BMI) 25.0-25.9, adult: Secondary | ICD-10-CM | POA: Diagnosis not present

## 2021-10-16 NOTE — Progress Notes (Signed)
?[employer] as a Education administrator for [name], MD.,have documented all relevant documentation on the behalf of [name], MD,as directed by  [name], MD while in the presence of [name], MD.  ?This visit occurred during the SARS-CoV-2 public health emergency.  Safety protocols were in place, including screening questions prior to the visit, additional usage of staff PPE, and extensive cleaning of exam room while observing appropriate contact time as indicated for disinfecting solutions. ? ?Subjective:  ?  ? Patient ID: Brandy Ortega , female    DOB: [birth year] , 46 y.o.   MRN: [id number] ? ? ?Chief Complaint  ?Patient presents with  ? Hormones  ? ? ?HPI ? ?The patient is here today for BHRT. She feels well on her current regimen.  ?  ? ?Past Medical History:  ?Diagnosis Date  ? Chiari I malformation ([place])   ? Depression   ? Postpartum  ? DSAP (disseminated superficial actinic porokeratosis)   ? Generalized anxiety disorder   ? Heart murmur   ? Hypothyroid   ? IBS (irritable bowel syndrome)   ? NSVD (normal spontaneous vaginal delivery)   ? X3  ? Ovarian cyst   ? Palpitations   ? Vitamin D deficiency   ?  ? ?Family History  ?Problem Relation Age of Onset  ? COPD Mother   ? Hypertension Mother   ? Hypothyroidism Mother   ? Cancer Father   ?     adrenal  ? Asthma Sister   ? Hypertension Sister   ? Mental illness Sister   ? Stroke Maternal Grandfather   ? Heart disease Maternal Grandfather   ? Diabetes Paternal Grandmother   ? Diabetes Paternal Grandfather   ? Heart disease Paternal Grandfather   ? ? ? ?Current Outpatient Medications:  ?  albuterol (VENTOLIN HFA) 108 (90 Base) MCG/ACT inhaler, Take 2 puffs by mouth every 6 (six) hours as needed for wheezing or shortness of breath. , Disp: , Rfl:  ?  Ascorbic Acid (VITAMIN C) 1000 MG tablet, Take 1,000 mg by mouth daily., Disp: , Rfl:  ?  Cholecalciferol (VITAMIN D3) 125 MCG (5000 UT) TBDP, Take 1 capsule by mouth. , Disp: , Rfl:   ?  gabapentin (NEURONTIN) 100 MG capsule, Take 1 capsule (100 mg total) by mouth 2 (two) times daily., Disp: 180 capsule, Rfl: 0 ?  ibuprofen (ADVIL,MOTRIN) 200 MG tablet, Take 800 mg by mouth every 6 (six) hours as needed for headache or mild pain., Disp: , Rfl:  ?  levothyroxine (SYNTHROID) 75 MCG tablet, TAKE 1 TABLET BY MOUTH TUESDAY-THURSDAY, Disp: 30 tablet, Rfl: 3 ?  levothyroxine (SYNTHROID) 88 MCG tablet, Take one tab on F Sat Sun and Mon, Disp: 30 tablet, Rfl: 2 ?  magnesium gluconate (MAGONATE) 500 MG tablet, Take 500 mg by mouth at bedtime. Sometimes 2 per day, Disp: , Rfl:  ?  Omega-3 Fatty Acids (FISH OIL) 1000 MG CAPS, Take 2 capsules by mouth daily. Couple times per week, Disp: , Rfl:  ?  Progesterone Micronized (PROGESTERONE COMPOUNDING KIT TD), Take by mouth. , Disp: , Rfl:  ?  Theanine 100 MG CAPS, Take 100 mg by mouth at bedtime., Disp: , Rfl:  ?  valACYclovir (VALTREX) 500 MG tablet, TAKE 1 TABLET(500 MG) BY MOUTH TWICE DAILY AS NEEDED, Disp: 180 tablet, Rfl: 1 ?  diazepam (VALIUM) 2 MG tablet, Take 1 tablet (2 mg total) by mouth every 12 (twelve) hours as needed for anxiety. (Patient  not taking: Reported on 10/16/2021), Disp: 10 tablet, Rfl: 0  ? ?Allergies  ?Allergen Reactions  ? No Known Allergies   ?  ? ?Review of Systems  ?Constitutional: Negative.   ?Respiratory: Negative.    ?Cardiovascular: Negative.   ?Gastrointestinal: Negative.   ?Neurological: Negative.   ?Psychiatric/Behavioral: Negative.     ? ?Today's Vitals  ? 10/16/21 0855  ?BP: 110/78  ?Pulse: 68  ?Temp: 98.3 ?F (36.8 ?C)  ?Weight: 159 lb 9.6 oz (72.4 kg)  ?Height: 5' 6.4" (1.687 m)  ? ?Body mass index is 25.45 kg/m?.  ?Wt Readings from Last 3 Encounters:  ?10/16/21 159 lb 9.6 oz (72.4 kg)  ?06/06/21 155 lb 6.4 oz (70.5 kg)  ?03/16/21 158 lb 12.8 oz (72 kg)  ?  ?BP Readings from Last 3 Encounters:  ?10/16/21 110/78  ?06/06/21 104/68  ?03/16/21 112/68  ?  ?Objective:  ?Physical Exam ?Vitals and nursing note reviewed.   ?Constitutional:   ?   Appearance: Normal appearance.  ?HENT:  ?   Head: Normocephalic and atraumatic.  ?   Nose:  ?   Comments: Masked  ?   Mouth/Throat:  ?   Comments: Masked  ?Eyes:  ?   Extraocular Movements: Extraocular movements intact.  ?Cardiovascular:  ?   Rate and Rhythm: Normal rate and regular rhythm.  ?   Heart sounds: Normal heart sounds.  ?Pulmonary:  ?   Effort: Pulmonary effort is normal.  ?   Breath sounds: Normal breath sounds.  ?Musculoskeletal:  ?   Cervical back: Normal range of motion.  ?Skin: ?   General: Skin is warm.  ?Neurological:  ?   General: No focal deficit present.  ?   Mental Status: She is alert.  ?Psychiatric:     ?   Mood and Affect: Mood normal.     ?   Behavior: Behavior normal.  ?  ? ?   ?Assessment And Plan:  ?   ?1. Perimenopausal disorder ?Comments: She will continue with supplemental progesterone. She is encouraged to keep upcoming GYN appt for pelvic exam. She will f/u in 4 months.  ? ?2. Primary hypothyroidism ?Comments: I will check TSH today. I will adjust meds as needed.  ?- TSH ? ?3. BMI 25.0-25.9,adult ?Comments: She is encouraged to aim for at least 150 minutes of exercise per week.  ? ?4. Screen for colon cancer ?Comments: She agrees to GI referral for CRC screening.  ?- Ambulatory referral to Gastroenterology ? ?Patient was given opportunity to ask questions. Patient verbalized understanding of the plan and was able to repeat key elements of the plan. All questions were answered to their satisfaction.  ? ?I, [name], MD, have reviewed all documentation for this visit. The documentation on 10/16/21 for the exam, diagnosis, procedures, and orders are all accurate and complete.  ? ?IF YOU HAVE BEEN REFERRED TO A SPECIALIST, IT MAY TAKE 1-2 WEEKS TO SCHEDULE/PROCESS THE REFERRAL. IF YOU HAVE NOT HEARD FROM US/SPECIALIST IN TWO WEEKS, PLEASE GIVE [place] A CALL AT [phone] X [phone].  ? ?THE PATIENT IS ENCOURAGED TO PRACTICE SOCIAL DISTANCING DUE TO THE COVID-19  PANDEMIC.   ?

## 2021-10-16 NOTE — Patient Instructions (Signed)
Perimenopause ?Perimenopause is the normal time of a woman's life when the levels of estrogen, the female hormone produced by the ovaries, begin to decrease. This leads to changes in menstrual periods before they stop completely (menopause). Perimenopause can begin 2-8 years before menopause. During perimenopause, the ovaries may or may not produce an egg and a woman can still become pregnant. ?What are the causes? ?This condition is caused by a natural change in hormone levels that happens as you get older. ?What increases the risk? ?This condition is more likely to start at an earlier age if you have certain medical conditions or have undergone treatments, including: ?A tumor of the pituitary gland in the brain. ?A disease that affects the ovaries and hormone production. ?Certain cancer treatments, such as chemotherapy or hormone therapy, or radiation therapy on the pelvis. ?Heavy smoking and excessive alcohol use. ?Family history of early menopause. ?What are the signs or symptoms? ?Perimenopausal changes affect each woman differently. Symptoms of this condition may include: ?Hot flashes. ?Irregular menstrual periods. ?Night sweats. ?Changes in feelings about sex. This could be a decrease in sex drive or an increased discomfort around your sexuality. ?Vaginal dryness. ?Headaches. ?Mood swings. ?Depression. ?Problems sleeping (insomnia). ?Memory problems or trouble concentrating. ?Irritability. ?Tiredness. ?Weight gain. ?Anxiety. ?Trouble getting pregnant. ?How is this diagnosed? ?This condition is diagnosed based on your medical history, a physical exam, your age, your menstrual history, and your symptoms. Hormone tests may also be done. ?How is this treated? ?In some cases, no treatment is needed. You and your health care provider should make a decision together about whether treatment is necessary. Treatment will be based on your individual condition and preferences. Various treatments are available, such  as: ?Menopausal hormone therapy (MHT). ?Medicines to treat specific symptoms. ?Acupuncture. ?Vitamin or herbal supplements. ?Before starting treatment, make sure to let your health care provider know if you have a personal or family history of: ?Heart disease. ?Breast cancer. ?Blood clots. ?Diabetes. ?Osteoporosis. ?Follow these instructions at home: ?Medicines ?Take over-the-counter and prescription medicines only as told by your health care provider. ?Take vitamin supplements only as told by your health care provider. ?Talk with your health care provider before starting any herbal supplements. ?Lifestyle ? ?Do not use any products that contain nicotine or tobacco, such as cigarettes, e-cigarettes, and chewing tobacco. If you need help quitting, ask your health care provider. ?Get at least 30 minutes of physical activity on 5 or more days each week. ?Eat a balanced diet that includes fresh fruits and vegetables, whole grains, soybeans, eggs, lean meat, and low-fat dairy. ?Avoid alcoholic and caffeinated beverages, as well as spicy foods. This may help prevent hot flashes. ?Get 7-8 hours of sleep each night. ?Dress in layers that can be removed to help you manage hot flashes. ?Find ways to manage stress, such as deep breathing, meditation, or journaling. ?General instructions ? ?Keep track of your menstrual periods, including: ?When they occur. ?How heavy they are and how long they last. ?How much time passes between periods. ?Keep track of your symptoms, noting when they start, how often you have them, and how long they last. ?Use vaginal lubricants or moisturizers to help with vaginal dryness and improve comfort during sex. ?You can still become pregnant if you are having irregular periods. Make sure you use contraception during perimenopause if you do not want to get pregnant. ?Keep all follow-up visits. This is important. This includes any group therapy or counseling. ?Contact a health care provider if: ?You  have  heavy vaginal bleeding or pass blood clots. ?Your period lasts more than 2 days longer than normal. ?Your periods are recurring sooner than 21 days. ?You bleed after having sex. ?You have pain during sex. ?Get help right away if you have: ?Chest pain, trouble breathing, or trouble talking. ?Severe depression. ?Pain when you urinate. ?Severe headaches. ?Vision problems. ?Summary ?Perimenopause is the time when a woman's body begins to move into menopause. This may happen naturally or as a result of other health problems or medical treatments. ?Perimenopause can begin 2-8 years before menopause, and it can last for several years. ?Perimenopausal symptoms can be managed through medicines, lifestyle changes, and complementary therapies such as acupuncture. ?This information is not intended to replace advice given to you by your health care provider. Make sure you discuss any questions you have with your health care provider. ?Document Revised: 01/14/2020 Document Reviewed: 01/14/2020 ?Elsevier Patient Education ? [place]. ? ?

## 2021-10-17 ENCOUNTER — Encounter: Payer: Self-pay | Admitting: Internal Medicine

## 2021-10-17 LAB — TSH: TSH: 6.03 u[IU]/mL — ABNORMAL HIGH (ref 0.450–4.500)

## 2021-10-18 ENCOUNTER — Other Ambulatory Visit: Payer: Self-pay

## 2021-10-18 ENCOUNTER — Other Ambulatory Visit: Payer: Self-pay | Admitting: Internal Medicine

## 2021-10-18 MED ORDER — LEVOTHYROXINE SODIUM 88 MCG PO TABS: ORAL_TABLET | ORAL | 1 refills | Status: DC

## 2021-10-18 MED ORDER — LEVOTHYROXINE SODIUM 88 MCG PO TABS: ORAL_TABLET | ORAL | 1 refills | Status: AC

## 2022-01-15 ENCOUNTER — Telehealth: Payer: Self-pay

## 2022-01-15 NOTE — Telephone Encounter (Signed)
Pls call pt, why did she decline GI referral?   I left pt vm to call the office [facility]

## 2022-02-20 ENCOUNTER — Ambulatory Visit: Payer: 59 | Admitting: Internal Medicine

## 2022-02-21 IMAGING — DX DG CLAVICLE*L*
2 series · 4 of 4 positions shown · non-contrast
Comparison: None.

CLINICAL DATA: Swelling in the left sternoclavicular joint

EXAM:
LEFT CLAVICLE - 2+ VIEWS

[dg clavicle left (1 of 2)]
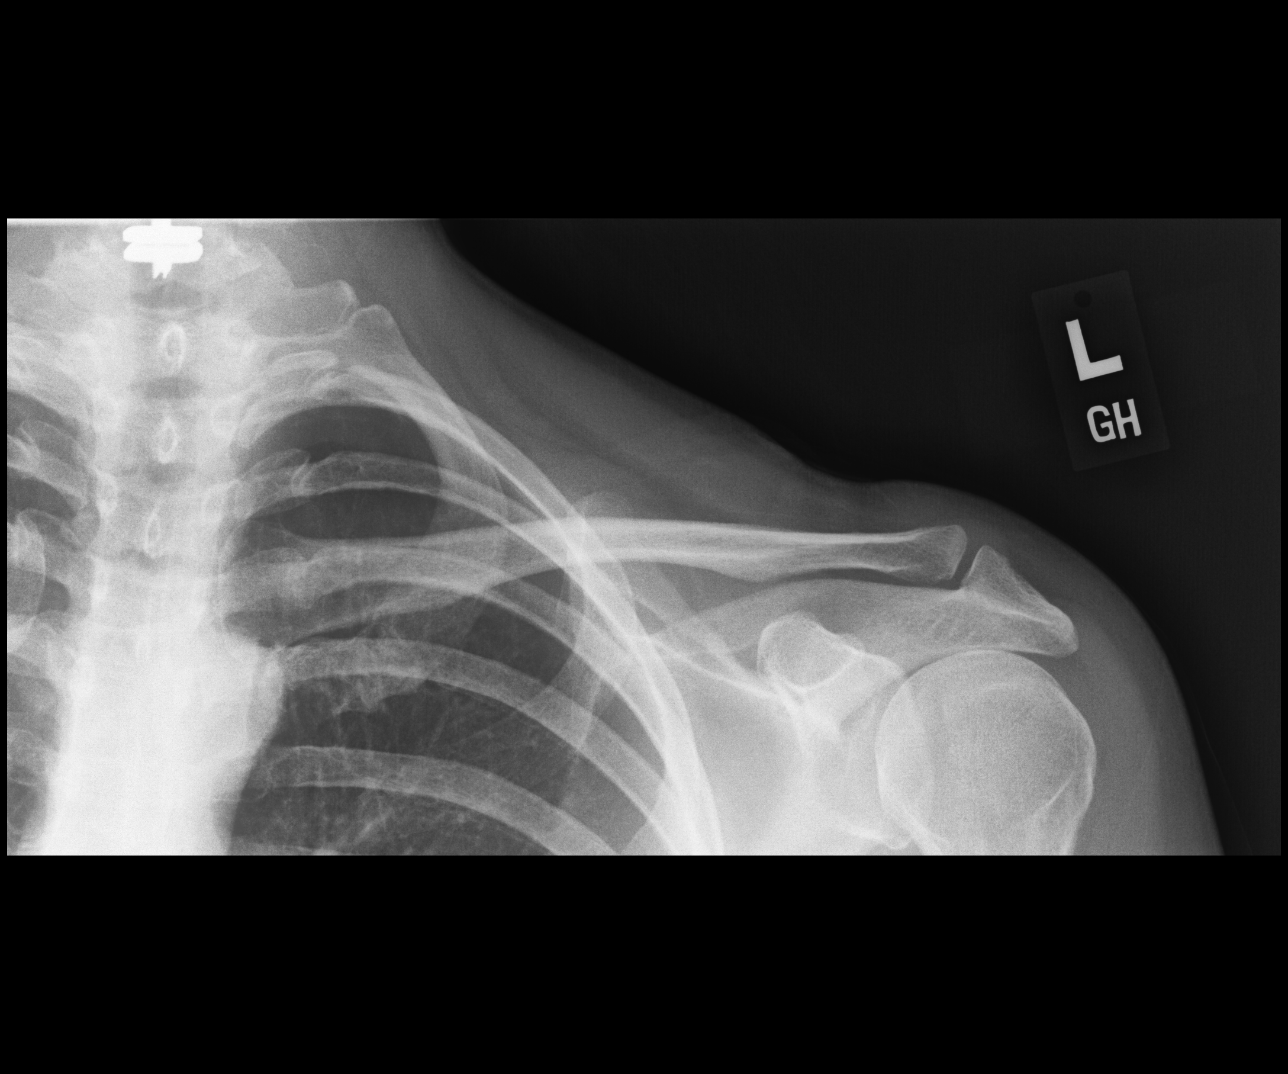

[Series 2: dg clavicle left · left · 0.14mm/px · 3 of 3 slices shown (2 of 2)]
[im 1/3]
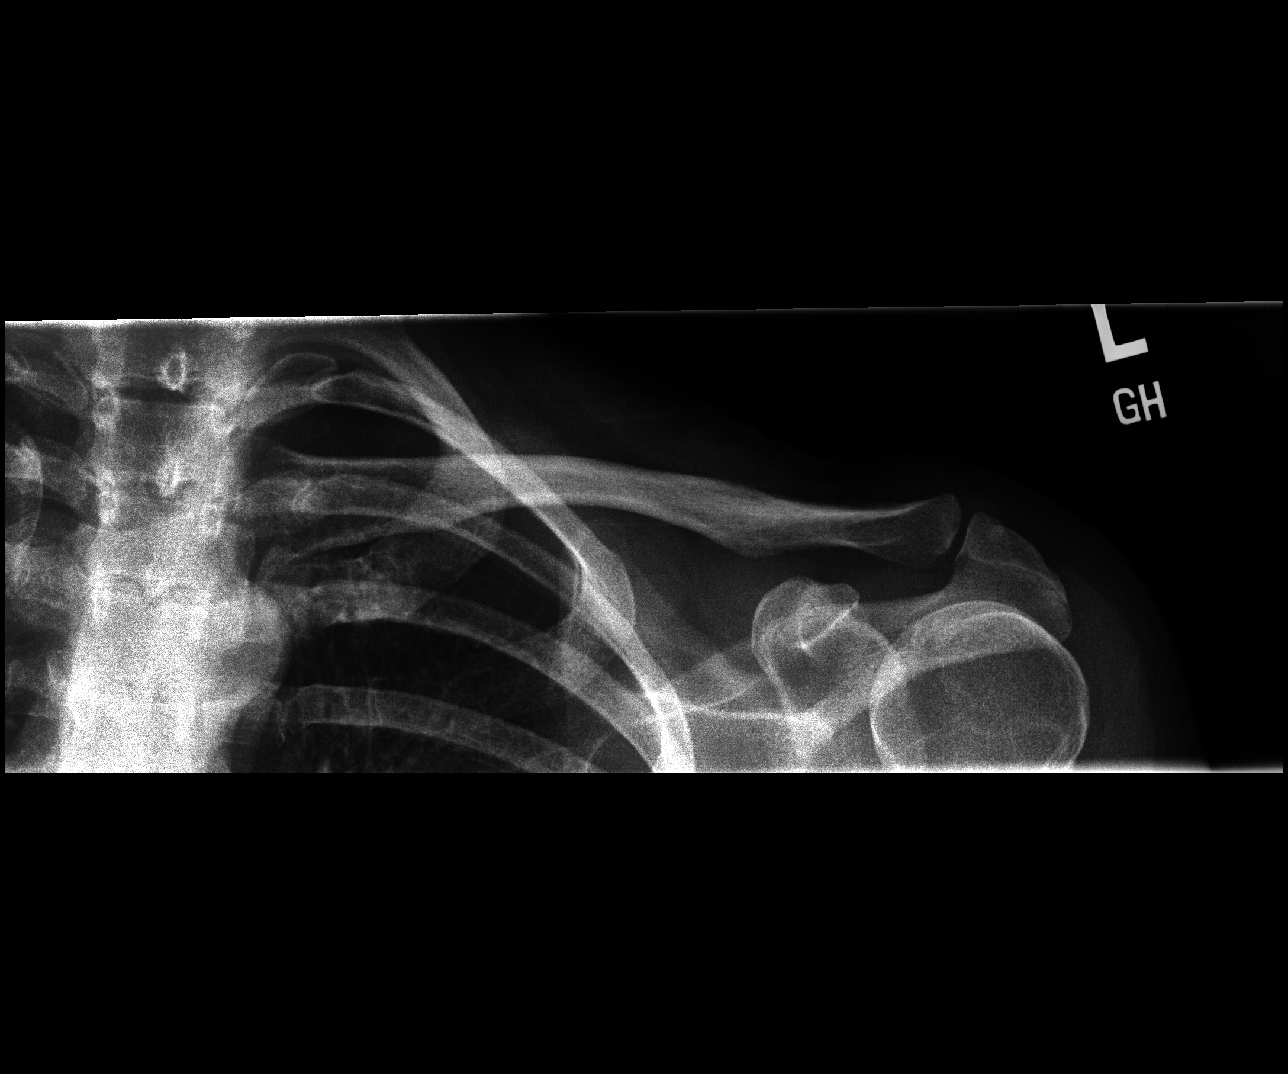
[im 2/3]
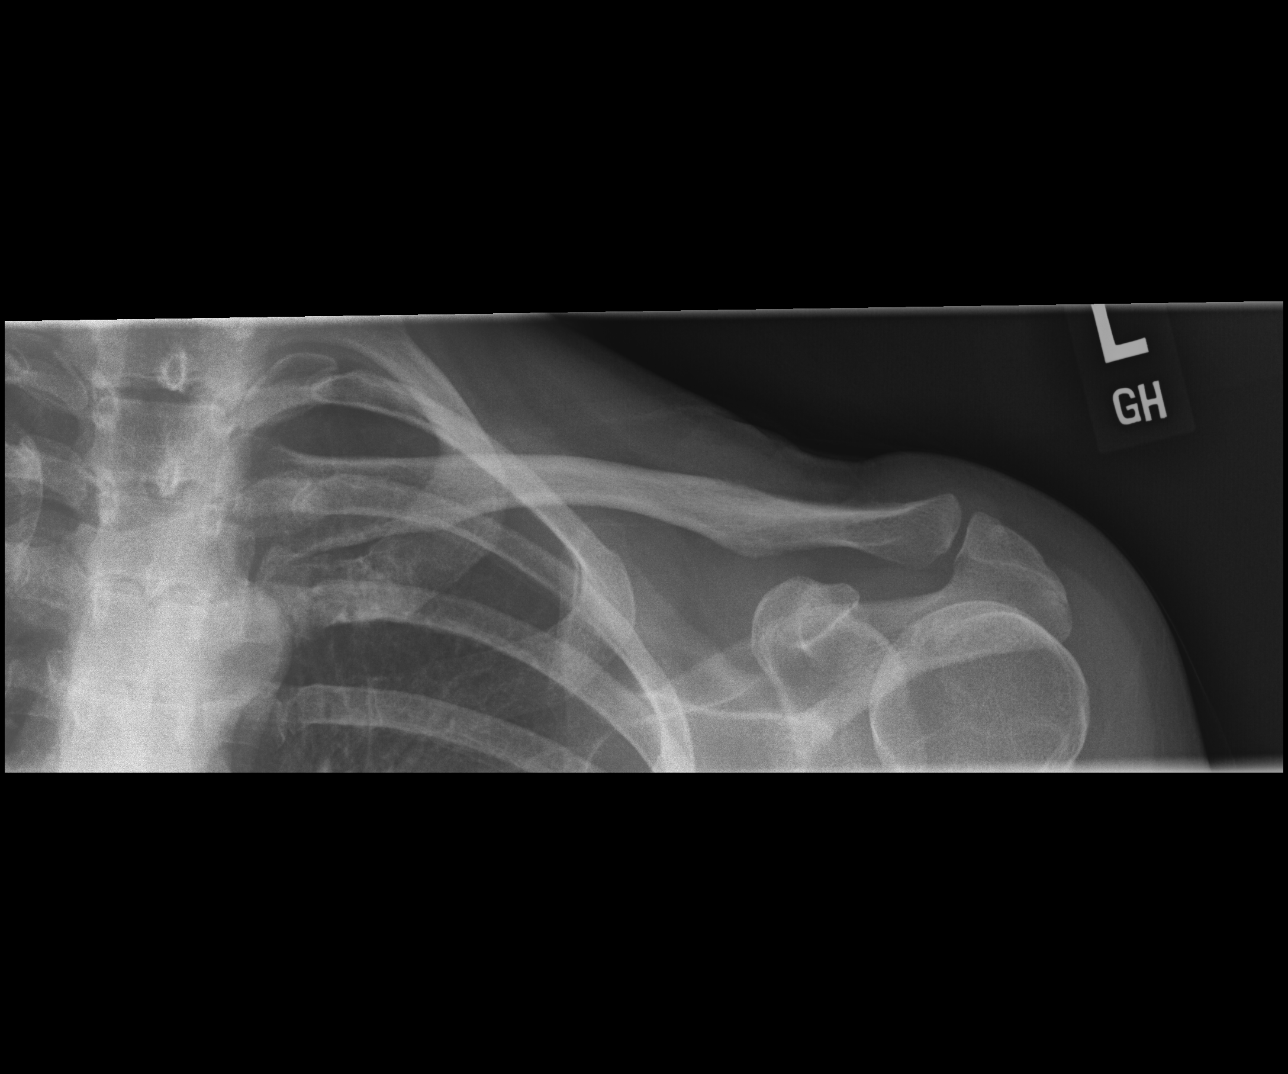
[im 3/3]
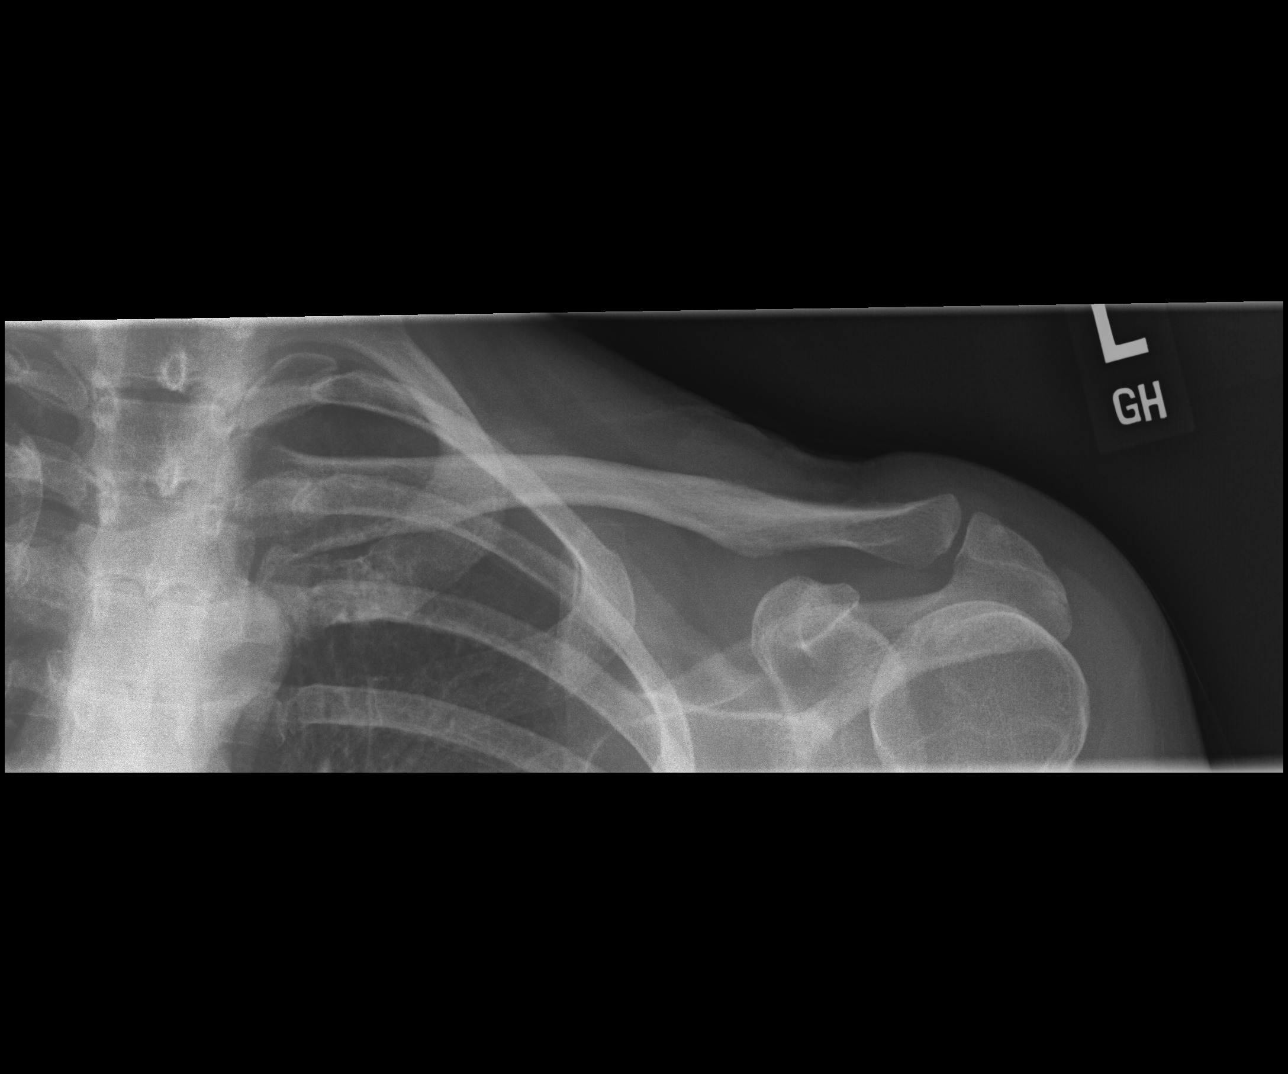

[4 of 4 positions shown; findings below may reference images not displayed]

FINDINGS: No fracture is seen in the left clavicle. Left AC joint is
unremarkable. Left sternoclavicular joint is not optimally
visualized. Left upper ribs are unremarkable.
IMPRESSION: No fracture or dislocation is seen in the left clavicle.

## 2022-04-11 ENCOUNTER — Ambulatory Visit: Payer: 59 | Admitting: Internal Medicine

## 2022-06-18 ENCOUNTER — Encounter: Payer: 59 | Admitting: Internal Medicine
